# Patient Record
Sex: Female | Born: 1980 | Race: Black or African American | Hispanic: No | Marital: Single | Smoking: Current some day smoker
Health system: Southern US, Community
[De-identification: ages and names within clinical notes are randomized; demographics above are authoritative.]

## PROBLEM LIST (undated history)

## (undated) DIAGNOSIS — M719 Bursopathy, unspecified: Secondary | ICD-10-CM

## (undated) DIAGNOSIS — D573 Sickle-cell trait: Secondary | ICD-10-CM

## (undated) DIAGNOSIS — R87629 Unspecified abnormal cytological findings in specimens from vagina: Secondary | ICD-10-CM

## (undated) DIAGNOSIS — O139 Gestational [pregnancy-induced] hypertension without significant proteinuria, unspecified trimester: Secondary | ICD-10-CM

## (undated) HISTORY — DX: Gestational (pregnancy-induced) hypertension without significant proteinuria, unspecified trimester: O13.9

## (undated) HISTORY — DX: Unspecified abnormal cytological findings in specimens from vagina: R87.629

## (undated) HISTORY — PX: OTHER SURGICAL HISTORY: SHX169

---

## 2000-09-04 ENCOUNTER — Emergency Department (HOSPITAL_COMMUNITY): Admission: EM | Admit: 2000-09-04 | Discharge: 2000-09-04 | Payer: Self-pay | Admitting: Emergency Medicine

## 2001-05-18 ENCOUNTER — Emergency Department (HOSPITAL_COMMUNITY): Admission: EM | Admit: 2001-05-18 | Discharge: 2001-05-18 | Payer: Self-pay

## 2001-05-28 ENCOUNTER — Emergency Department (HOSPITAL_COMMUNITY): Admission: EM | Admit: 2001-05-28 | Discharge: 2001-05-28 | Payer: Self-pay | Admitting: Emergency Medicine

## 2001-10-30 ENCOUNTER — Emergency Department (HOSPITAL_COMMUNITY): Admission: EM | Admit: 2001-10-30 | Discharge: 2001-10-30 | Payer: Self-pay | Admitting: Emergency Medicine

## 2001-12-12 ENCOUNTER — Emergency Department (HOSPITAL_COMMUNITY): Admission: EM | Admit: 2001-12-12 | Discharge: 2001-12-12 | Payer: Self-pay | Admitting: Emergency Medicine

## 2002-03-02 ENCOUNTER — Encounter (INDEPENDENT_AMBULATORY_CARE_PROVIDER_SITE_OTHER): Payer: Self-pay

## 2002-03-02 ENCOUNTER — Encounter: Admission: RE | Admit: 2002-03-02 | Discharge: 2002-03-02 | Payer: Self-pay | Admitting: *Deleted

## 2002-03-05 ENCOUNTER — Inpatient Hospital Stay (HOSPITAL_COMMUNITY): Admission: AD | Admit: 2002-03-05 | Discharge: 2002-03-05 | Payer: Self-pay | Admitting: *Deleted

## 2002-03-16 ENCOUNTER — Encounter: Admission: RE | Admit: 2002-03-16 | Discharge: 2002-03-16 | Payer: Self-pay | Admitting: Obstetrics and Gynecology

## 2002-07-11 ENCOUNTER — Encounter (INDEPENDENT_AMBULATORY_CARE_PROVIDER_SITE_OTHER): Payer: Self-pay | Admitting: Specialist

## 2002-07-11 ENCOUNTER — Encounter: Admission: RE | Admit: 2002-07-11 | Discharge: 2002-07-11 | Payer: Self-pay | Admitting: Obstetrics and Gynecology

## 2002-11-07 ENCOUNTER — Encounter: Admission: RE | Admit: 2002-11-07 | Discharge: 2002-11-07 | Payer: Self-pay | Admitting: Obstetrics and Gynecology

## 2003-01-15 ENCOUNTER — Emergency Department (HOSPITAL_COMMUNITY): Admission: EM | Admit: 2003-01-15 | Discharge: 2003-01-15 | Payer: Self-pay | Admitting: Emergency Medicine

## 2003-02-16 ENCOUNTER — Emergency Department (HOSPITAL_COMMUNITY): Admission: EM | Admit: 2003-02-16 | Discharge: 2003-02-16 | Payer: Self-pay | Admitting: Emergency Medicine

## 2003-07-26 ENCOUNTER — Inpatient Hospital Stay (HOSPITAL_COMMUNITY): Admission: AD | Admit: 2003-07-26 | Discharge: 2003-07-27 | Payer: Self-pay | Admitting: Obstetrics & Gynecology

## 2003-07-29 ENCOUNTER — Inpatient Hospital Stay (HOSPITAL_COMMUNITY): Admission: AD | Admit: 2003-07-29 | Discharge: 2003-07-29 | Payer: Self-pay | Admitting: Obstetrics & Gynecology

## 2003-08-07 ENCOUNTER — Inpatient Hospital Stay (HOSPITAL_COMMUNITY): Admission: AD | Admit: 2003-08-07 | Discharge: 2003-08-07 | Payer: Self-pay | Admitting: Obstetrics and Gynecology

## 2005-02-25 ENCOUNTER — Ambulatory Visit (HOSPITAL_COMMUNITY): Admission: RE | Admit: 2005-02-25 | Discharge: 2005-02-25 | Payer: Self-pay | Admitting: Obstetrics & Gynecology

## 2005-05-05 ENCOUNTER — Ambulatory Visit (HOSPITAL_COMMUNITY): Admission: RE | Admit: 2005-05-05 | Discharge: 2005-05-05 | Payer: Self-pay | Admitting: Obstetrics

## 2005-07-14 ENCOUNTER — Ambulatory Visit (HOSPITAL_COMMUNITY): Admission: RE | Admit: 2005-07-14 | Discharge: 2005-07-14 | Payer: Self-pay | Admitting: Obstetrics

## 2005-09-10 ENCOUNTER — Ambulatory Visit (HOSPITAL_COMMUNITY): Admission: RE | Admit: 2005-09-10 | Discharge: 2005-09-10 | Payer: Self-pay | Admitting: Obstetrics

## 2005-09-10 ENCOUNTER — Inpatient Hospital Stay (HOSPITAL_COMMUNITY): Admission: AD | Admit: 2005-09-10 | Discharge: 2005-09-10 | Payer: Self-pay | Admitting: Obstetrics & Gynecology

## 2005-09-14 ENCOUNTER — Ambulatory Visit (HOSPITAL_COMMUNITY): Admission: RE | Admit: 2005-09-14 | Discharge: 2005-09-14 | Payer: Self-pay | Admitting: Obstetrics & Gynecology

## 2005-09-23 ENCOUNTER — Inpatient Hospital Stay (HOSPITAL_COMMUNITY): Admission: AD | Admit: 2005-09-23 | Discharge: 2005-09-27 | Payer: Self-pay | Admitting: Obstetrics & Gynecology

## 2005-09-25 ENCOUNTER — Encounter (INDEPENDENT_AMBULATORY_CARE_PROVIDER_SITE_OTHER): Payer: Self-pay | Admitting: *Deleted

## 2006-03-10 ENCOUNTER — Emergency Department (HOSPITAL_COMMUNITY): Admission: EM | Admit: 2006-03-10 | Discharge: 2006-03-10 | Payer: Self-pay | Admitting: Emergency Medicine

## 2006-09-20 ENCOUNTER — Emergency Department (HOSPITAL_COMMUNITY): Admission: EM | Admit: 2006-09-20 | Discharge: 2006-09-20 | Payer: Self-pay | Admitting: Emergency Medicine

## 2006-11-06 ENCOUNTER — Emergency Department (HOSPITAL_COMMUNITY): Admission: EM | Admit: 2006-11-06 | Discharge: 2006-11-06 | Payer: Self-pay | Admitting: Emergency Medicine

## 2007-07-18 ENCOUNTER — Inpatient Hospital Stay (HOSPITAL_COMMUNITY): Admission: AD | Admit: 2007-07-18 | Discharge: 2007-07-18 | Payer: Self-pay | Admitting: Obstetrics & Gynecology

## 2007-08-23 ENCOUNTER — Ambulatory Visit (HOSPITAL_COMMUNITY): Admission: RE | Admit: 2007-08-23 | Discharge: 2007-08-23 | Payer: Self-pay | Admitting: Obstetrics

## 2007-08-23 ENCOUNTER — Encounter: Payer: Self-pay | Admitting: Obstetrics

## 2007-10-11 ENCOUNTER — Ambulatory Visit (HOSPITAL_COMMUNITY): Admission: RE | Admit: 2007-10-11 | Discharge: 2007-10-11 | Payer: Self-pay | Admitting: Obstetrics

## 2008-03-06 ENCOUNTER — Inpatient Hospital Stay (HOSPITAL_COMMUNITY): Admission: AD | Admit: 2008-03-06 | Discharge: 2008-03-08 | Payer: Self-pay | Admitting: Obstetrics

## 2008-04-05 ENCOUNTER — Emergency Department (HOSPITAL_COMMUNITY): Admission: EM | Admit: 2008-04-05 | Discharge: 2008-04-05 | Payer: Self-pay | Admitting: Emergency Medicine

## 2008-04-05 ENCOUNTER — Other Ambulatory Visit: Payer: Self-pay | Admitting: Obstetrics

## 2008-04-07 ENCOUNTER — Emergency Department (HOSPITAL_COMMUNITY): Admission: EM | Admit: 2008-04-07 | Discharge: 2008-04-07 | Payer: Self-pay | Admitting: Emergency Medicine

## 2008-08-08 ENCOUNTER — Emergency Department (HOSPITAL_COMMUNITY): Admission: EM | Admit: 2008-08-08 | Discharge: 2008-08-08 | Payer: Self-pay | Admitting: Family Medicine

## 2008-12-02 ENCOUNTER — Emergency Department (HOSPITAL_COMMUNITY): Admission: EM | Admit: 2008-12-02 | Discharge: 2008-12-02 | Payer: Self-pay | Admitting: Family Medicine

## 2010-01-18 ENCOUNTER — Emergency Department (HOSPITAL_COMMUNITY)
Admission: EM | Admit: 2010-01-18 | Discharge: 2010-01-18 | Payer: Self-pay | Source: Home / Self Care | Admitting: Emergency Medicine

## 2010-02-16 ENCOUNTER — Encounter: Payer: Self-pay | Admitting: Obstetrics & Gynecology

## 2010-04-03 ENCOUNTER — Inpatient Hospital Stay (INDEPENDENT_AMBULATORY_CARE_PROVIDER_SITE_OTHER)
Admission: RE | Admit: 2010-04-03 | Discharge: 2010-04-03 | Disposition: A | Discharge status: Home / Self Care | Payer: BC Managed Care – PPO | Source: Ambulatory Visit | Attending: Family Medicine | Admitting: Family Medicine

## 2010-04-03 DIAGNOSIS — R05 Cough: Secondary | ICD-10-CM

## 2010-04-07 LAB — WET PREP, GENITAL
Clue Cells Wet Prep HPF POC: NONE SEEN
Yeast Wet Prep HPF POC: NONE SEEN

## 2010-04-07 LAB — POCT URINALYSIS DIPSTICK
Hgb urine dipstick: NEGATIVE
Protein, ur: NEGATIVE mg/dL
Specific Gravity, Urine: 1.02 (ref 1.005–1.030)
Urobilinogen, UA: 0.2 mg/dL (ref 0.0–1.0)
pH: 6 (ref 5.0–8.0)

## 2010-04-07 LAB — GC/CHLAMYDIA PROBE AMP, GENITAL: Chlamydia, DNA Probe: NEGATIVE

## 2010-04-30 LAB — POCT URINALYSIS DIP (DEVICE)
Bilirubin Urine: NEGATIVE
Ketones, ur: 15 mg/dL — AB
Nitrite: NEGATIVE
Urobilinogen, UA: 0.2 mg/dL (ref 0.0–1.0)

## 2010-04-30 LAB — POCT PREGNANCY, URINE: Preg Test, Ur: NEGATIVE

## 2010-05-08 LAB — URINALYSIS, ROUTINE W REFLEX MICROSCOPIC
Hgb urine dipstick: NEGATIVE
Ketones, ur: NEGATIVE mg/dL
Nitrite: NEGATIVE

## 2010-05-08 LAB — COMPREHENSIVE METABOLIC PANEL
AST: 16 U/L (ref 0–37)
Albumin: 3.7 g/dL (ref 3.5–5.2)
Alkaline Phosphatase: 116 U/L (ref 39–117)
BUN: 4 mg/dL — ABNORMAL LOW (ref 6–23)
CO2: 27 mEq/L (ref 19–32)
Creatinine, Ser: 0.73 mg/dL (ref 0.4–1.2)
GFR calc Af Amer: >60 mL/min (ref >60)
GFR calc non Af Amer: >60 mL/min (ref >60)
Sodium: 141 mEq/L (ref 135–145)
Total Bilirubin: 0.3 mg/dL (ref 0.3–1.2)
Total Protein: 6.9 g/dL (ref 6.0–8.3)

## 2010-05-08 LAB — CBC
Hemoglobin: 12.5 g/dL (ref 12.0–15.0)
RDW: 14.9 % (ref 11.5–15.5)

## 2010-05-08 LAB — LACTATE DEHYDROGENASE: LDH: 163 U/L (ref 94–250)

## 2010-05-13 LAB — CBC
HCT: 30.4 % — ABNORMAL LOW (ref 36.0–46.0)
Hemoglobin: 10 g/dL — ABNORMAL LOW (ref 12.0–15.0)
MCHC: 33 g/dL (ref 30.0–36.0)
MCV: 80.5 fL (ref 78.0–100.0)
Platelets: 164 10*3/uL (ref 150–400)
Platelets: 172 10*3/uL (ref 150–400)
RBC: 3.95 MIL/uL (ref 3.87–5.11)
RDW: 14.9 % (ref 11.5–15.5)
WBC: 11.4 10*3/uL — ABNORMAL HIGH (ref 4.0–10.5)

## 2010-05-13 LAB — DIFFERENTIAL
Basophils Absolute: 0 10*3/uL (ref 0.0–0.1)
Eosinophils Absolute: 0.2 10*3/uL (ref 0.0–0.7)
Lymphocytes Relative: 20 % (ref 12–46)
Lymphs Abs: 1.9 10*3/uL (ref 0.7–4.0)
Neutrophils Relative %: 69 % (ref 43–77)

## 2010-05-13 LAB — RPR: RPR Ser Ql: NONREACTIVE

## 2010-07-14 ENCOUNTER — Encounter: Payer: BC Managed Care – PPO | Admitting: Obstetrics & Gynecology

## 2010-10-23 LAB — WET PREP, GENITAL
Clue Cells Wet Prep HPF POC: NONE SEEN
Trich, Wet Prep: NONE SEEN
Yeast Wet Prep HPF POC: NONE SEEN

## 2010-10-23 LAB — URINALYSIS, ROUTINE W REFLEX MICROSCOPIC
Bilirubin Urine: NEGATIVE
Nitrite: NEGATIVE
Specific Gravity, Urine: 1.015
pH: 6.5

## 2010-10-23 LAB — CBC
Platelets: 300
RBC: 4.75
WBC: 8.8

## 2010-10-23 LAB — POCT PREGNANCY, URINE: Operator id: 27769

## 2010-10-23 LAB — HCG, QUANTITATIVE, PREGNANCY: hCG, Beta Chain, Quant, S: 13500 — ABNORMAL HIGH

## 2010-10-23 LAB — URINE MICROSCOPIC-ADD ON

## 2010-10-23 LAB — ABO/RH: ABO/RH(D): O POS

## 2010-11-06 LAB — URINE MICROSCOPIC-ADD ON

## 2010-11-06 LAB — URINALYSIS, ROUTINE W REFLEX MICROSCOPIC
Bilirubin Urine: NEGATIVE
Glucose, UA: NEGATIVE
Ketones, ur: NEGATIVE
Nitrite: NEGATIVE
Protein, ur: NEGATIVE
Specific Gravity, Urine: 1.015
Urobilinogen, UA: 0.2
pH: 6

## 2010-11-06 LAB — POCT CARDIAC MARKERS
Myoglobin, poc: 73.5
Operator id: 272551

## 2010-11-06 LAB — I-STAT 8, (EC8 V) (CONVERTED LAB)
BUN: 8
Bicarbonate: 26.5 — ABNORMAL HIGH
Glucose, Bld: 92
HCT: 43
Hemoglobin: 14.6
Operator id: 272551
Potassium: 5.3 — ABNORMAL HIGH
Sodium: 137
TCO2: 28

## 2011-06-04 ENCOUNTER — Encounter (HOSPITAL_COMMUNITY): Payer: Self-pay | Admitting: Emergency Medicine

## 2011-06-04 ENCOUNTER — Emergency Department (INDEPENDENT_AMBULATORY_CARE_PROVIDER_SITE_OTHER)
Admission: EM | Admit: 2011-06-04 | Discharge: 2011-06-04 | Disposition: A | Discharge status: Home / Self Care | Payer: BC Managed Care – PPO | Source: Home / Self Care | Attending: Family Medicine | Admitting: Family Medicine

## 2011-06-04 DIAGNOSIS — L710 Perioral dermatitis: Secondary | ICD-10-CM

## 2011-06-04 DIAGNOSIS — L719 Rosacea, unspecified: Secondary | ICD-10-CM

## 2011-06-04 MED ORDER — METRONIDAZOLE 1 % EX GEL: Freq: Every day | CUTANEOUS | Status: DC

## 2011-06-04 MED ORDER — PREDNISONE 20 MG PO TABS: ORAL_TABLET | ORAL | Status: AC

## 2011-06-04 MED ORDER — HYDROXYZINE HCL 50 MG PO TABS: 50.0000 mg | ORAL_TABLET | Freq: Three times a day (TID) | ORAL | Status: AC | PRN

## 2011-06-04 NOTE — Discharge Instructions (Signed)
Avoid using any cosmetics or chemicals in-your-face and especially around or in your lips. Take the prescribed medication as instructed. Can use Vaseline only for lubrication. You  need to use the prescribed gel for at least 8 weeks and keep using even even after your symptoms improve. Followup with a dermatologist if worsening or recurrent symptoms despite following treatment .

## 2011-06-04 NOTE — ED Notes (Signed)
PT HERE WITH LIP SWELLING THAT STARTED SLOWLY 4 WEEKS AGO BUT HAS PROGRESSED WITH OUTBREAK AROUND LIPS,ITCHING AND IRRITATION.PT TRIED BENADRYL/CARMAX BUT NOT WORKING.

## 2011-06-05 NOTE — ED Provider Notes (Signed)
History     CSN: [id number]  Arrival date & time 06/04/11  1630   First MD Initiated Contact with Patient 06/04/11 1636      Chief Complaint  Patient presents with  . Rash    (Consider location/radiation/quality/duration/timing/severity/associated sxs/prior treatment) HPI Comments: 31 y/o female no significant PMH here c/o redness, swelling and itchiness around lips for about 4 weeks was mildly initially but worsening. Has used carmax, and cocoa butter. Also benadryl helps but symptoms recur. No new cosmetics or known exposures.    History reviewed. No pertinent past medical history.  History reviewed. No pertinent past surgical history.  No family history on file.  History  Substance Use Topics  . Smoking status: Current Everyday Smoker  . Smokeless tobacco: Not on file  . Alcohol Use: No    OB History    Grav Para Term Preterm Abortions TAB SAB Ect Mult Living                  Review of Systems  Constitutional: Negative for fever, chills, appetite change and unexpected weight change.  HENT: Negative for congestion, sore throat, mouth sores, trouble swallowing and neck pain.   Musculoskeletal: Negative for arthralgias.  Skin: Positive for rash.       As per HPI  Neurological: Negative for headaches.    Allergies  Review of patient's allergies indicates no known allergies.  Home Medications   Current Outpatient Rx  Name Route Sig Dispense Refill  . HYDROXYZINE HCL 50 MG PO TABS Oral Take 1 tablet (50 mg total) by mouth 3 (three) times daily as needed for itching. 30 tablet 0  . METRONIDAZOLE 1 % EX GEL Topical Apply topically daily. Apply daily in affected area for 8 weeks 60 g 1  . PREDNISONE 20 MG PO TABS  2 tabs po daily for 5 days 10 tablet no    BP 116/71  Pulse 86  Temp(Src) 98.4 F (36.9 C) (Oral)  Resp 18  SpO2 100%  Physical Exam  Nursing note and vitals reviewed. Constitutional: She is oriented to person, place, and time. She appears  well-developed and well-nourished. No distress.  HENT:  Head: Normocephalic and atraumatic.  Nose: Nose normal.  Mouth/Throat: Oropharynx is clear and moist. No oropharyngeal exudate.  Eyes: Conjunctivae are normal. Pupils are equal, round, and reactive to light. Right eye exhibits no discharge. Left eye exhibits no discharge. No scleral icterus.  Neck: Normal range of motion. Neck supple.  Cardiovascular: Normal heart sounds.   Pulmonary/Chest: Breath sounds normal.  Abdominal: Soft. There is no tenderness.  Lymphadenopathy:    She has no cervical adenopathy.  Neurological: She is alert and oriented to person, place, and time.  Skin:       Perioral fine papular erythema impress some degree of  hypopigmentation. Mild perioral and lip swelling. No brakes, vesicles, pustules, crustings or scabs. No lips or buccal mucosa ulcers.     ED Course  Procedures (including critical care time)  Labs Reviewed - No data to display No results found.   1. POD (perioral dermatitis)       MDM  Patient denies prior topical steroid use. Decided to treat with hydroxyzine, metronidazol gel and oral prednisone. Follow up with dermatology as needed.        Sharin Grave, MD 06/06/11 2956

## 2011-07-07 ENCOUNTER — Encounter (HOSPITAL_COMMUNITY): Payer: Self-pay | Admitting: *Deleted

## 2011-07-07 ENCOUNTER — Emergency Department (HOSPITAL_COMMUNITY)
Admission: EM | Admit: 2011-07-07 | Discharge: 2011-07-07 | Disposition: A | Discharge status: Home / Self Care | Payer: BC Managed Care – PPO | Attending: Emergency Medicine | Admitting: Emergency Medicine

## 2011-07-07 DIAGNOSIS — K047 Periapical abscess without sinus: Secondary | ICD-10-CM | POA: Insufficient documentation

## 2011-07-07 DIAGNOSIS — K029 Dental caries, unspecified: Secondary | ICD-10-CM | POA: Insufficient documentation

## 2011-07-07 DIAGNOSIS — F17200 Nicotine dependence, unspecified, uncomplicated: Secondary | ICD-10-CM | POA: Insufficient documentation

## 2011-07-07 MED ORDER — IBUPROFEN 800 MG PO TABS
800.0000 mg | ORAL_TABLET | Freq: Once | ORAL | Status: AC
  Administered 2011-07-07: 800 mg via ORAL
  Filled 2011-07-07: qty 1

## 2011-07-07 MED ORDER — PENICILLIN V POTASSIUM 500 MG PO TABS: 500.0000 mg | ORAL_TABLET | Freq: Three times a day (TID) | ORAL | Status: AC

## 2011-07-07 MED ORDER — IBUPROFEN 800 MG PO TABS: 800.0000 mg | ORAL_TABLET | Freq: Three times a day (TID) | ORAL | Status: AC

## 2011-07-07 MED ORDER — HYDROCODONE-ACETAMINOPHEN 5-500 MG PO TABS: 1.0000 | ORAL_TABLET | Freq: Four times a day (QID) | ORAL | Status: AC | PRN

## 2011-07-07 NOTE — ED Provider Notes (Signed)
History     CSN: [id number]  Arrival date & time 07/07/11  5621   First MD Initiated Contact with Patient 07/07/11 [phone]      Chief Complaint  Patient presents with  . Dental Pain    (Consider location/radiation/quality/duration/timing/severity/associated sxs/prior treatment) Patient is a 31 y.o. female presenting with tooth pain. The history is provided by the patient.  Dental Pain  Patient presents to ER complaining of right lower dental pain x 24 hours that was acute onset and associated with a decayed tooth that lost its cap about 2 months ago. Patient denies fevers, chills, facial swelling, difficulty breathing or swallowing. Pain aggravated by chewing and cold air. Took tylenol yesterday without relief of pain. Pain is constant and unchanging. Denies HA, n/v. States she has no known medical problems.   History reviewed. No pertinent past medical history.  History reviewed. No pertinent past surgical history.  No family history on file.  History  Substance Use Topics  . Smoking status: Current Everyday Smoker  . Smokeless tobacco: Not on file  . Alcohol Use: No    OB History    Grav Para Term Preterm Abortions TAB SAB Ect Mult Living                  Review of Systems  All other systems reviewed and are negative.    Allergies  Review of patient's allergies indicates no known allergies.  Home Medications   Current Outpatient Rx  Name Route Sig Dispense Refill  . HYDROCODONE-ACETAMINOPHEN 5-500 MG PO TABS Oral Take 1-2 tablets by mouth every 6 (six) hours as needed for pain. 15 tablet 0  . IBUPROFEN 800 MG PO TABS Oral Take 1 tablet (800 mg total) by mouth 3 (three) times daily. Take 800mg  by mouth at breakfast, lunch and dinner for the next 5 days 21 tablet 0  . PENICILLIN V POTASSIUM 500 MG PO TABS Oral Take 1 tablet (500 mg total) by mouth 3 (three) times daily. 30 tablet 0    BP 135/100  Pulse 84  Temp(Src) 98.2 F (36.8 C) (Oral)  Resp 18  SpO2  100%  Physical Exam  Nursing note and vitals reviewed. Constitutional: She is oriented to person, place, and time. She appears well-developed and well-nourished.       Non toxic appearing.   HENT:  Head: Normocephalic and atraumatic.       Massive dental decay of right lower premolar with gingival edema and with TTP of surrounding gingiva but no gingival mass or fluctuance.   Eyes: Conjunctivae are normal.  Neck: Normal range of motion. Neck supple.  Cardiovascular: Normal rate and regular rhythm.   Pulmonary/Chest: Effort normal and breath sounds normal.  Musculoskeletal: Normal range of motion.  Lymphadenopathy:    She has no cervical adenopathy.  Neurological: She is alert and oriented to person, place, and time.  Skin: Skin is warm and dry.  Psychiatric: She has a normal mood and affect. Her behavior is normal.    ED Course  Procedures (including critical care time)  Patient drove self to ER. PO ibuprofen.   Labs Reviewed - No data to display No results found.   1. Dental abscess       MDM  Gingival edema surrounding decayed tooth but no fluctuance or induration or right lower premolar. Non toxica appearing and afebrile. Drinking without difficulty. Dental follow up given.         [place], [place] 07/07/11 [phone]

## 2011-07-07 NOTE — ED Provider Notes (Signed)
Medical screening examination/treatment/procedure(s) were performed by non-physician practitioner and as supervising physician I was immediately available for consultation/collaboration.  Olivia Mackie, MD 07/07/11 [phone]

## 2011-07-07 NOTE — ED Notes (Signed)
PT is here with right lower tooth and jaw pain that started yesterday

## 2011-07-07 NOTE — Discharge Instructions (Signed)
Apply warm compresses to jaw throughout the day. Take antibiotic in completion. Take hydrocodone-acetaminophen as directed, as needed for pain but do not drive or operate machinery with pain medication use and alternate with ibuprofen for additional pain relief. Followup with a dentist is very important for ongoing evaluation and management of recurrent dental pain with dental infection. Call on call dentist today to schedule close follow up appointment. However, return to emergency department for emergent changing or worsening symptoms.  Abscessed Tooth A tooth abscess is a collection of infected fluid (pus) from a bacterial infection in the inner part of the tooth (pulp). It usually occurs at the end of the tooth's root.  CAUSES   A very bad cavity (extensive tooth decay).   Trauma to the tooth, such as a broken or chipped tooth, that allows bacteria to enter into the pulp.  SYMPTOMS  Severe pain in and around the infected tooth.   Swelling and redness around the abscessed tooth or in the mouth or face.   Tenderness.   Pus drainage.   Bad breath.   Bitter taste in the mouth.   Difficulty swallowing.   Difficulty opening the mouth.   Feeling sick to your stomach (nauseous).   Vomiting.   Chills.   Swollen neck glands.  DIAGNOSIS  A medical and dental history will be taken.   An examination will be performed by tapping on the abscessed tooth.   X-rays may be taken of the tooth to identify the abscess.  TREATMENT The goal of treatment is to eliminate the infection.   You may be prescribed antibiotic medicine to stop the infection from spreading.   A root canal may be performed to save the tooth. If the tooth cannot be saved, it may be pulled (extracted) and the abscess may be drained.  HOME CARE INSTRUCTIONS  Only take over-the-counter or prescription medicines for pain, fever, or discomfort as directed by your caregiver.   Do not drive after taking pain medicine  (narcotics).   Rinse your mouth (gargle) often with salt water ( tsp salt in 8 oz of warm water) to relieve pain or swelling.   Do not apply heat to the outside of your face.   Return to your dentist for further treatment as directed.  SEEK IMMEDIATE DENTAL CARE IF:  You have a temperature by mouth above 102 F (38.9 C), not controlled by medicine.   You have chills or a very bad headache.   You have problems breathing or swallowing.   Your have trouble opening your mouth.   You develop swelling in the neck or around the eye.   Your pain is not helped by medicine.   Your pain is getting worse instead of better.  Document Released: 01/12/2005 Document Revised: 01/01/2011 Document Reviewed: 04/22/2010 [facility] Patient Information 2012 [place], [place].

## 2012-01-20 ENCOUNTER — Encounter (HOSPITAL_COMMUNITY): Payer: Self-pay | Admitting: Adult Health

## 2012-01-20 ENCOUNTER — Emergency Department (HOSPITAL_COMMUNITY)
Admission: EM | Admit: 2012-01-20 | Discharge: 2012-01-20 | Disposition: A | Discharge status: Home / Self Care | Payer: BC Managed Care – PPO | Attending: Emergency Medicine | Admitting: Emergency Medicine

## 2012-01-20 DIAGNOSIS — R220 Localized swelling, mass and lump, head: Secondary | ICD-10-CM | POA: Insufficient documentation

## 2012-01-20 DIAGNOSIS — F17200 Nicotine dependence, unspecified, uncomplicated: Secondary | ICD-10-CM | POA: Insufficient documentation

## 2012-01-20 DIAGNOSIS — K047 Periapical abscess without sinus: Secondary | ICD-10-CM

## 2012-01-20 MED ORDER — PENICILLIN V POTASSIUM 500 MG PO TABS: 500.0000 mg | ORAL_TABLET | Freq: Four times a day (QID) | ORAL | Status: DC

## 2012-01-20 MED ORDER — TRAMADOL HCL 50 MG PO TABS: 50.0000 mg | ORAL_TABLET | Freq: Once | ORAL | Status: DC

## 2012-01-20 MED ORDER — TRAMADOL HCL 50 MG PO TABS
50.0000 mg | ORAL_TABLET | Freq: Once | ORAL | Status: AC
  Administered 2012-01-20: 50 mg via ORAL
  Filled 2012-01-20: qty 1

## 2012-01-20 MED ORDER — PENICILLIN V POTASSIUM 250 MG PO TABS
500.0000 mg | ORAL_TABLET | Freq: Once | ORAL | Status: AC
  Administered 2012-01-20: 500 mg via ORAL
  Filled 2012-01-20: qty 2

## 2012-01-20 NOTE — ED Provider Notes (Signed)
Medical screening examination/treatment/procedure(s) were performed by non-physician practitioner and as supervising physician I was immediately available for consultation/collaboration.    Mikkel Charrette R Nickalus Thornsberry, MD 01/20/12 2205 

## 2012-01-20 NOTE — ED Notes (Signed)
Presents with lower right dental pain . Dental caries noted. Pain for 2 days

## 2012-01-20 NOTE — ED Provider Notes (Signed)
History     CSN: [id number]  Arrival date & time 01/20/12  2019   First MD Initiated Contact with Patient 01/20/12 2039      Chief Complaint  Patient presents with  . Dental Pain    (Consider location/radiation/quality/duration/timing/severity/associated sxs/prior treatment) HPI Comments: Patient with a large cavity in R lower 2nd molar for months now with facial swelling and increased pain Has been using Ibuprofen without relief   Patient is a 31 y.o. female presenting with tooth pain. The history is provided by the patient.  Dental PainThe primary symptoms include mouth pain. Primary symptoms do not include dental injury, headaches, fever or sore throat. The symptoms began more than 1 month ago. The symptoms are worsening. The symptoms are chronic. The symptoms occur constantly.  Additional symptoms include: facial swelling. Additional symptoms do not include: trouble swallowing.    History reviewed. No pertinent past medical history.  History reviewed. No pertinent past surgical history.  History reviewed. No pertinent family history.  History  Substance Use Topics  . Smoking status: Current Every Day Smoker  . Smokeless tobacco: Not on file  . Alcohol Use: No    OB History    Grav Para Term Preterm Abortions TAB SAB Ect Mult Living                  Review of Systems  Constitutional: Negative for fever.  HENT: Positive for facial swelling and dental problem. Negative for congestion, sore throat, rhinorrhea and trouble swallowing.   Respiratory: Negative.   Gastrointestinal: Negative.   Genitourinary: Negative.   Musculoskeletal: Negative.   Skin: Negative.   Neurological: Negative for dizziness and headaches.    Allergies  Review of patient's allergies indicates no known allergies.  Home Medications   Current Outpatient Rx  Name  Route  Sig  Dispense  Refill  . IBUPROFEN 200 MG PO TABS   Oral   Take 600 mg by mouth every 4 (four) hours as needed. For  pain         . PRENATAL PO   Oral   Take 1 tablet by mouth daily.         Marland Kitchen PENICILLIN V POTASSIUM 500 MG PO TABS   Oral   Take 1 tablet (500 mg total) by mouth 4 (four) times daily.   39 tablet   0   . TRAMADOL HCL 50 MG PO TABS   Oral   Take 1 tablet (50 mg total) by mouth once.   30 tablet   0     BP 129/85  Pulse 88  Temp 97.4 F (36.3 C) (Oral)  Resp 20  SpO2 99%  Physical Exam  Constitutional: She is oriented to person, place, and time. She appears well-developed and well-nourished.  HENT:  Head: Normocephalic.    Right Ear: External ear normal.  Left Ear: External ear normal.  Mouth/Throat: Oropharynx is clear and moist. No tonsillar abscesses.    Eyes: Pupils are equal, round, and reactive to light.  Cardiovascular: Normal rate.   Pulmonary/Chest: Effort normal.  Musculoskeletal: Normal range of motion.  Lymphadenopathy:    She has cervical adenopathy.  Neurological: She is alert and oriented to person, place, and time.  Skin: Skin is warm.    ED Course  Procedures (including critical care time)  Labs Reviewed - No data to display No results found.   1. Dental abscess       MDM   Dental abscess  Arman Filter, NP 01/20/12 2109

## 2012-05-18 ENCOUNTER — Other Ambulatory Visit: Payer: Self-pay

## 2012-05-18 ENCOUNTER — Encounter (HOSPITAL_COMMUNITY): Payer: Self-pay | Admitting: Emergency Medicine

## 2012-05-18 ENCOUNTER — Emergency Department (HOSPITAL_COMMUNITY): Payer: BC Managed Care – PPO

## 2012-05-18 ENCOUNTER — Emergency Department (HOSPITAL_COMMUNITY)
Admission: EM | Admit: 2012-05-18 | Discharge: 2012-05-18 | Disposition: A | Discharge status: Home / Self Care | Payer: BC Managed Care – PPO | Attending: Emergency Medicine | Admitting: Emergency Medicine

## 2012-05-18 DIAGNOSIS — R002 Palpitations: Secondary | ICD-10-CM | POA: Insufficient documentation

## 2012-05-18 DIAGNOSIS — F17200 Nicotine dependence, unspecified, uncomplicated: Secondary | ICD-10-CM | POA: Insufficient documentation

## 2012-05-18 DIAGNOSIS — R0602 Shortness of breath: Secondary | ICD-10-CM | POA: Insufficient documentation

## 2012-05-18 DIAGNOSIS — A599 Trichomoniasis, unspecified: Secondary | ICD-10-CM | POA: Insufficient documentation

## 2012-05-18 DIAGNOSIS — Z3202 Encounter for pregnancy test, result negative: Secondary | ICD-10-CM | POA: Insufficient documentation

## 2012-05-18 DIAGNOSIS — Z79899 Other long term (current) drug therapy: Secondary | ICD-10-CM | POA: Insufficient documentation

## 2012-05-18 DIAGNOSIS — R42 Dizziness and giddiness: Secondary | ICD-10-CM

## 2012-05-18 LAB — COMPREHENSIVE METABOLIC PANEL
ALT: 16 U/L (ref 0–35)
Albumin: 4 g/dL (ref 3.5–5.2)
Calcium: 9.7 mg/dL (ref 8.4–10.5)
GFR calc Af Amer: >90 mL/min (ref >90)
Glucose, Bld: 90 mg/dL (ref 70–99)
Sodium: 139 mEq/L (ref 135–145)
Total Protein: 7.8 g/dL (ref 6.0–8.3)

## 2012-05-18 LAB — URINALYSIS, MICROSCOPIC ONLY
Bilirubin Urine: NEGATIVE
Nitrite: NEGATIVE
Specific Gravity, Urine: 1.014 (ref 1.005–1.030)
pH: 7.5 (ref 5.0–8.0)

## 2012-05-18 LAB — CBC WITH DIFFERENTIAL/PLATELET
Basophils Absolute: 0 10*3/uL (ref 0.0–0.1)
Basophils Relative: 0 % (ref 0–1)
Eosinophils Absolute: 0.6 10*3/uL (ref 0.0–0.7)
Eosinophils Relative: 7 % — ABNORMAL HIGH (ref 0–5)
Lymphs Abs: 2.7 10*3/uL (ref 0.7–4.0)
MCH: 27 pg (ref 26.0–34.0)
MCHC: 34.7 g/dL (ref 30.0–36.0)
MCV: 77.8 fL — ABNORMAL LOW (ref 78.0–100.0)
Platelets: 280 10*3/uL (ref 150–400)
RDW: 13.1 % (ref 11.5–15.5)

## 2012-05-18 MED ORDER — METRONIDAZOLE 500 MG PO TABS: 500.0000 mg | ORAL_TABLET | Freq: Two times a day (BID) | ORAL | Status: DC

## 2012-05-18 NOTE — ED Notes (Signed)
Kelly, PA at bedside. 

## 2012-05-18 NOTE — ED Notes (Signed)
Pt states she is feeling better and denies having onset of episode of dizziness and shortness of breath since being here. Pt alert and mentating appropriately.

## 2012-05-18 NOTE — ED Provider Notes (Signed)
History     CSN: [id number]  Arrival date & time 05/18/12  1053   First MD Initiated Contact with Patient 05/18/12 1206      Chief Complaint  Patient presents with  . Dizziness  . Shortness of Breath    (Consider location/radiation/quality/duration/timing/severity/associated sxs/prior treatment) HPI Comments: Patient is a 32 year old female with no significant past medical history who presents for dizziness with position change with onset this morning. Patient states that when she reaches down towards the floor she becomes dizzy upon standing, feeling like the room is spinning. Patient states that dizziness lasts 3-5 seconds and she has had approximately 3 episodes since this morning the last of which was 2 hours ago. The patient admits to associated shortness of breath and feeling like her heart is racing. She denies fever, syncope, vision changes, hearing loss, ear pain, upper respiratory symptoms, chest pain, nausea or vomiting, abdominal pain, urinary symptoms and weakness in her extremities. Patient states LMP ended 1 week ago; on implanon birth control. She admits to a 4 hour drive last week, coming home from [place]; denies recent long plane flights.  Patient is a 32 y.o. female presenting with shortness of breath. The history is provided by the patient. No language interpreter was used.  Shortness of Breath Associated symptoms: no abdominal pain, no fever, no headaches, no neck pain and no vomiting     History reviewed. No pertinent past medical history.  History reviewed. No pertinent past surgical history.  History reviewed. No pertinent family history.  History  Substance Use Topics  . Smoking status: Current Every Day Smoker  . Smokeless tobacco: Not on file  . Alcohol Use: Yes     Comment: occ    OB History   Grav Para Term Preterm Abortions TAB SAB Ect Mult Living                  Review of Systems  Constitutional: Negative for fever.  HENT: Negative  for neck pain and neck stiffness.   Eyes: Negative for visual disturbance.  Respiratory: Positive for shortness of breath.   Cardiovascular: Positive for palpitations.  Gastrointestinal: Negative for nausea, vomiting and abdominal pain.  Neurological: Positive for dizziness. Negative for syncope, weakness and headaches.  All other systems reviewed and are negative.    Allergies  Review of patient's allergies indicates no known allergies.  Home Medications   Current Outpatient Rx  Name  Route  Sig  Dispense  Refill  . Prenatal Vit-Fe Fumarate-FA (PRENATAL PO)   Oral   Take 1 tablet by mouth daily.         . metroNIDAZOLE (FLAGYL) 500 MG tablet   Oral   Take 1 tablet (500 mg total) by mouth 2 (two) times daily.   14 tablet   0     BP 114/69  Pulse 74  Temp(Src) 98.2 F (36.8 C) (Oral)  Resp 18  SpO2 100%  LMP 05/11/2012  Physical Exam  Nursing note and vitals reviewed. Constitutional: She is oriented to person, place, and time. She appears well-developed and well-nourished. No distress.  HENT:  Head: Normocephalic and atraumatic.  Mouth/Throat: Oropharynx is clear and moist. No oropharyngeal exudate.  Eyes: Conjunctivae and EOM are normal. Pupils are equal, round, and reactive to light. No scleral icterus.  No nystagmus with EOMs  Neck: Normal range of motion. Neck supple.  Cardiovascular: Normal rate, regular rhythm, normal heart sounds and intact distal pulses.   Pulmonary/Chest: Effort normal. No respiratory distress.  She has no wheezes. She has no rales.  +rhonchi in RUL  Abdominal: Soft. She exhibits no distension and no mass. There is no tenderness. There is no rebound.  Musculoskeletal: Normal range of motion. She exhibits no edema (No pitting edema in b/l lower extremities).  Lymphadenopathy:    She has no cervical adenopathy.  Neurological: She is alert and oriented to person, place, and time. She has normal reflexes. No cranial nerve deficit.  No  sensory or motor deficits appreciated.  Skin: Skin is warm and dry. No rash noted. She is not diaphoretic. No erythema.  Psychiatric: She has a normal mood and affect. Her behavior is normal.    ED Course  Procedures (including critical care time)  Labs Reviewed  CBC WITH DIFFERENTIAL - Abnormal; Notable for the following:    MCV 77.8 (*)    Eosinophils Relative 7 (*)    All other components within normal limits  COMPREHENSIVE METABOLIC PANEL - Abnormal; Notable for the following:    Total Bilirubin 0.2 (*)    All other components within normal limits  URINALYSIS, MICROSCOPIC ONLY - Abnormal; Notable for the following:    APPearance CLOUDY (*)    Leukocytes, UA MODERATE (*)    Squamous Epithelial / LPF MANY (*)    All other components within normal limits  POCT PREGNANCY, URINE   Dg Chest 2 View  05/18/2012  *RADIOLOGY REPORT*  Clinical Data: Shortness of breath.  Left upper chest and shoulder pain.  CHEST - 2 VIEW  Comparison: One-view chest 11/06/2006  Findings: Heart size is normal.  The lungs are clear.  The visualized soft tissues and bony thorax are unremarkable.  IMPRESSION: Negative two-view chest x-ray.   Original Report Authenticated By: Marin Roberts, M.D.      Date: 05/18/2012  Rate: 70  Rhythm: normal sinus rhythm  QRS Axis: normal  Intervals: normal  ST/T Wave abnormalities: normal  Conduction Disutrbances:none  Narrative Interpretation: NSR without STEMI or T wave inversion  Old EKG Reviewed: none available I have personally reviewed and interpreted this EKG.   1. Dizziness   2. Trichomonas infection     MDM  Patient is a 32 year old female with no significant past medical history who presents for dizziness with position change with onset this morning; she admits to 3 episodes lasting 3-5 seconds each. On physical exam, patient is well and nontoxic appearing, neurovascularly intact without sensory or motor deficits; no nystagmus with EOMs. W/u to  include CBC, CMP, UA, urine preg, CXR and EKG. Patient's orthostatics negative for changes in BP or HR.  W/u significant for trichomonas on UA. CXR without evidence of fx, PTX, PNA, or pleural effusion. EKG significant for NSR without arrythmia, STEMI, or T wave inversion. Patient has remained hemodynamically stable and in good spirits without another dizziness episode; no complaints. Stable for d/c with PCP follow up for further evaluation of symptoms and flagyl for tx of trichomonas. Indications for ED return discussed. Patient states comfort and understanding with d/c plan with no unaddressed concerns.      Antony Madura, PA-C 05/19/12 1259

## 2012-05-18 NOTE — ED Notes (Signed)
Pt sts some dizziness and SOB; pt sts worse when bends over starting when waking this am

## 2012-05-18 NOTE — ED Notes (Signed)
Kelly PA at bedside   

## 2012-05-18 NOTE — ED Notes (Signed)
Pt alert and mentating appropriately upon d/c. Pt given d/c teaching, prescription, and follow up care instructions. NAD noted upon d/c Pt has no further questions and verbalizes understanding of d/c teaching. Pt ambulatory upon d/c.

## 2012-05-19 NOTE — ED Provider Notes (Signed)
Medical screening examination/treatment/procedure(s) were performed by non-physician practitioner and as supervising physician I was immediately available for consultation/collaboration. Devoria Albe, MD, Armando Gang   Ward Givens, MD 05/19/12 [phone]

## 2012-11-24 ENCOUNTER — Emergency Department (INDEPENDENT_AMBULATORY_CARE_PROVIDER_SITE_OTHER)
Admission: EM | Admit: 2012-11-24 | Discharge: 2012-11-24 | Disposition: A | Discharge status: Home / Self Care | Payer: BC Managed Care – PPO | Source: Home / Self Care | Attending: Family Medicine | Admitting: Family Medicine

## 2012-11-24 ENCOUNTER — Encounter (HOSPITAL_COMMUNITY): Payer: Self-pay | Admitting: Emergency Medicine

## 2012-11-24 DIAGNOSIS — S335XXA Sprain of ligaments of lumbar spine, initial encounter: Secondary | ICD-10-CM

## 2012-11-24 DIAGNOSIS — S39012A Strain of muscle, fascia and tendon of lower back, initial encounter: Secondary | ICD-10-CM

## 2012-11-24 DIAGNOSIS — K047 Periapical abscess without sinus: Secondary | ICD-10-CM

## 2012-11-24 HISTORY — DX: Bursopathy, unspecified: M71.9

## 2012-11-24 LAB — POCT URINALYSIS DIP (DEVICE)
Hgb urine dipstick: NEGATIVE
Protein, ur: NEGATIVE mg/dL
Specific Gravity, Urine: 1.02 (ref 1.005–1.030)
Urobilinogen, UA: 0.2 mg/dL (ref 0.0–1.0)

## 2012-11-24 MED ORDER — HYDROCODONE-ACETAMINOPHEN 5-325 MG PO TABS: 1.0000 | ORAL_TABLET | ORAL | Status: DC | PRN

## 2012-11-24 MED ORDER — PREDNISONE 20 MG PO TABS: 40.0000 mg | ORAL_TABLET | Freq: Every day | ORAL | Status: DC

## 2012-11-24 MED ORDER — IBUPROFEN 800 MG PO TABS
ORAL_TABLET | ORAL | Status: AC
  Filled 2012-11-24: qty 1

## 2012-11-24 MED ORDER — PENICILLIN V POTASSIUM 500 MG PO TABS: 500.0000 mg | ORAL_TABLET | Freq: Three times a day (TID) | ORAL | Status: DC

## 2012-11-24 MED ORDER — IBUPROFEN 800 MG PO TABS
800.0000 mg | ORAL_TABLET | Freq: Once | ORAL | Status: AC
  Administered 2012-11-24: 800 mg via ORAL

## 2012-11-24 MED ORDER — CYCLOBENZAPRINE HCL 10 MG PO TABS: 10.0000 mg | ORAL_TABLET | Freq: Three times a day (TID) | ORAL | Status: DC | PRN

## 2012-11-24 NOTE — ED Notes (Addendum)
Patient has multiple complaints: c/o left lower gum pain, history of the same.  Reports she had an abscess in the past.  Patient took two antibiotics this episode that were left over from prior episode of the same. Patient also c/o left flank pain, intermittent for several months.  No vaginal discharge, last bm was yesterday-normal per patient, denies urinary symptoms.  Patient reports being on a diet since April-no red meat, drinks water, eats vegetables, and exercises 4 times a week.

## 2012-11-29 NOTE — ED Provider Notes (Signed)
CSN: [id number]     Arrival date & time 11/24/12  1134 History   First MD Initiated Contact with Patient 11/24/12 1234     Chief Complaint  Patient presents with  . Dental Pain  . Flank Pain   (Consider location/radiation/quality/duration/timing/severity/associated sxs/prior Treatment) Patient is a 32 y.o. female presenting with tooth pain and flank pain. The history is provided by the patient.  Dental Pain Location:  Lower Lower teeth location:  31/RL 2nd molar and 32/RL 3rd molar Quality:  Pulsating Severity:  Severe Onset quality:  Gradual Timing:  Constant Progression:  Worsening Chronicity:  Recurrent Context: abscess and dental caries   Relieved by:  Nothing Worsened by:  Touching Associated symptoms: gum swelling   Associated symptoms: no difficulty swallowing, no facial pain, no facial swelling, no fever, no neck pain and no neck swelling   Risk factors: lack of dental care   Flank Pain  Pt reports onset of pain to (R) lower back molar that has worsened over last 24 hours. She was unable to sleep last night d/t pain. Pt admits she has had problems with this tooth in the past and was given antibiotic and pain improved. Pain started again yesterday and has quickly worsened. Pt took a couple of antibiotics left from last prescription but unsure of the name.  Pt also reports recurrence of (L) LBP that she has had before. The pain does not radiate and is not associated w/ any lower limb weakness, numbness or tingling. Denies incontinence of bowel or bladder. Denies UTI symptoms or fever. States she does some heavy lifting on her job and it flares from time to time. She also admits she has been trying to loose weight and has been eating healthy and working out several times a week since April.   Past Medical History  Diagnosis Date  . Bursitis    History reviewed. No pertinent past surgical history. No family history on file. History  Substance Use Topics  . Smoking status:  Current Every Day Smoker  . Smokeless tobacco: Not on file  . Alcohol Use: Yes     Comment: occ   OB History   Grav Para Term Preterm Abortions TAB SAB Ect Mult Living                 Review of Systems  Constitutional: Negative.  Negative for fever.  HENT: Negative.  Negative for facial swelling.   Eyes: Negative.   Respiratory: Negative.   Cardiovascular: Negative.   Gastrointestinal: Negative.   Endocrine: Negative.   Genitourinary: Negative for dysuria and frequency.  Musculoskeletal: Positive for back pain. Negative for neck pain.  Allergic/Immunologic: Negative.   Neurological: Negative.   Hematological: Negative.   Psychiatric/Behavioral: Negative.     Allergies  Review of patient's allergies indicates no known allergies.  Home Medications   Current Outpatient Rx  Name  Route  Sig  Dispense  Refill  . cyclobenzaprine (FLEXERIL) 10 MG tablet   Oral   Take 1 tablet (10 mg total) by mouth 3 (three) times daily as needed for muscle spasms (and or low back muscle pain).   20 tablet   0   . HYDROcodone-acetaminophen (NORCO/VICODIN) 5-325 MG per tablet   Oral   Take 1 tablet by mouth every 4 (four) hours as needed for pain.   15 tablet   0   . metroNIDAZOLE (FLAGYL) 500 MG tablet   Oral   Take 1 tablet (500 mg total) by mouth 2 (two) times  daily.   14 tablet   0   . penicillin v potassium (VEETID) 500 MG tablet   Oral   Take 1 tablet (500 mg total) by mouth 3 (three) times daily.   30 tablet   0   . predniSONE (DELTASONE) 20 MG tablet   Oral   Take 2 tablets (40 mg total) by mouth daily.   10 tablet   0   . Prenatal Vit-Fe Fumarate-FA (PRENATAL PO)   Oral   Take 1 tablet by mouth daily.          BP 125/86  Pulse 72  Temp(Src) 98.5 F (36.9 C) (Oral)  Resp 14  SpO2 98%  LMP 10/18/2012 Physical Exam  Constitutional: She is oriented to person, place, and time. She appears well-developed and well-nourished.  HENT:  Head: Normocephalic and  atraumatic.  Mouth/Throat:    Tooth number 31 w/ significant decay to gumline on anterior portion of tooth. Tooth number 32 also w/ some decay. Mild erythema and swelling to surrounding gingiva of both teeth. No pururlent drainage noted. No facial swelling.   Eyes: Conjunctivae are normal.  Neck: Neck supple.  Cardiovascular: Normal rate.   Pulmonary/Chest: Effort normal.  Genitourinary: Vagina normal.  Musculoskeletal:       Back:  TTP over (L) lumbar paraspinal region. No bony TTP.   Neurological: She is alert and oriented to person, place, and time.  Skin: Skin is warm and dry.  Psychiatric: She has a normal mood and affect.    ED Course  Procedures (including critical care time) Labs Review Labs Reviewed  POCT URINALYSIS DIP (DEVICE) - Abnormal; Notable for the following:    Ketones, ur 15 (*)    Leukocytes, UA TRACE (*)    All other components within normal limits   Imaging Review No results found.  EKG Interpretation     Ventricular Rate:    PR Interval:    QRS Duration:   QT Interval:    QTC Calculation:   R Axis:     Text Interpretation:              MDM   1. Tooth abscess   2. Lumbar strain, initial encounter    Likely early Tooth abscess of number 31 and possibly 32. Will start on Pen-V-K TID x 10 days and a short course of medication for pain. Pt given 2 dentists referrals to arrange f/u for likely tooth extraction.  (L) LBP exacerbated by heavy lifting on her job. Will give Flexeril and a week of prednisone. She is to avoid heavy lifting or strenuous exercise for a week. Pt is arrange f/u w/ orthopedic referral if back pain persists. Pt is agreeable w/ plan.     Leanne Chang, NP 11/29/12 0105  Leanne Chang, NP 12/21/12 2002

## 2012-12-23 NOTE — ED Provider Notes (Signed)
Medical screening examination/treatment/procedure(s) were performed by a resident physician or non-physician practitioner and as the supervising physician I was immediately available for consultation/collaboration.  Tommey Barret, MD    Cheyrl Buley S Aaliyah Gavel, MD 12/23/12 2000 

## 2013-03-23 ENCOUNTER — Ambulatory Visit: Payer: Self-pay | Admitting: Obstetrics

## 2013-04-06 ENCOUNTER — Encounter: Payer: Self-pay | Admitting: Obstetrics

## 2013-04-06 ENCOUNTER — Ambulatory Visit (INDEPENDENT_AMBULATORY_CARE_PROVIDER_SITE_OTHER): Payer: BC Managed Care – PPO | Admitting: Obstetrics

## 2013-04-06 VITALS — BP 125/87 | HR 83 | Temp 97.2°F | Ht 64.0 in | Wt 204.0 lb

## 2013-04-06 DIAGNOSIS — Z113 Encounter for screening for infections with a predominantly sexual mode of transmission: Secondary | ICD-10-CM

## 2013-04-06 DIAGNOSIS — Z01419 Encounter for gynecological examination (general) (routine) without abnormal findings: Secondary | ICD-10-CM

## 2013-04-06 DIAGNOSIS — Z124 Encounter for screening for malignant neoplasm of cervix: Secondary | ICD-10-CM

## 2013-04-06 MED ORDER — PNV PRENATAL PLUS MULTIVITAMIN 27-1 MG PO TABS: 1.0000 | ORAL_TABLET | Freq: Every day | ORAL | Status: DC

## 2013-04-06 NOTE — Progress Notes (Signed)
Subjective:     [name] is a 33 y.o. female here for a routine exam.  Current complaints: annual exam. Patient states she has no concerns at this time.  Personal health questionnaire reviewed: yes.   Gynecologic History Patient's last menstrual period was 04/02/2013. Contraception: Nexplanon Last Pap: 2013. Results were: normal  Obstetric History OB History  No data available     The following portions of the patient's history were reviewed and updated as appropriate: allergies, current medications, past family history, past medical history, past social history, past surgical history and problem list.  Review of Systems Pertinent items are noted in HPI.    Objective:    General appearance: alert and no distress Breasts: normal appearance, no masses or tenderness Abdomen: normal findings: soft, non-tender Pelvic: cervix normal in appearance, external genitalia normal, no adnexal masses or tenderness, no cervical motion tenderness, rectovaginal septum normal, uterus normal size, shape, and consistency and vagina normal without discharge    Assessment:    Healthy female exam.    Plan:    Contraception: Nexplanon. Follow up in: 1 year.

## 2013-04-07 LAB — WET PREP BY MOLECULAR PROBE
Candida species: NEGATIVE
GARDNERELLA VAGINALIS: NEGATIVE
TRICHOMONAS VAG: NEGATIVE

## 2013-04-07 LAB — GC/CHLAMYDIA PROBE AMP
CT PROBE, AMP APTIMA: NEGATIVE
GC Probe RNA: NEGATIVE

## 2013-04-10 LAB — PAP IG W/ RFLX HPV ASCU

## 2013-04-11 LAB — HUMAN PAPILLOMAVIRUS, HIGH RISK: HPV DNA HIGH RISK: DETECTED — AB

## 2014-02-25 ENCOUNTER — Emergency Department (HOSPITAL_COMMUNITY)
Admission: EM | Admit: 2014-02-25 | Discharge: 2014-02-25 | Disposition: A | Discharge status: Home / Self Care | Payer: BC Managed Care – PPO | Source: Home / Self Care | Attending: Family Medicine | Admitting: Family Medicine

## 2014-02-25 ENCOUNTER — Encounter (HOSPITAL_COMMUNITY): Payer: Self-pay | Admitting: *Deleted

## 2014-02-25 DIAGNOSIS — K029 Dental caries, unspecified: Secondary | ICD-10-CM

## 2014-02-25 MED ORDER — LIDOCAINE VISCOUS 2 % MT SOLN: OROMUCOSAL | Status: DC

## 2014-02-25 MED ORDER — NAPROXEN 500 MG PO TABS: 500.0000 mg | ORAL_TABLET | Freq: Two times a day (BID) | ORAL | Status: DC

## 2014-02-25 NOTE — Discharge Instructions (Signed)
Dental Pain °A tooth ache may be caused by cavities (tooth decay). Cavities expose the nerve of the tooth to air and hot or cold temperatures. It may come from an infection or abscess (also called a boil or furuncle) around your tooth. It is also often caused by dental caries (tooth decay). This causes the pain you are having. °DIAGNOSIS  °Your caregiver can diagnose this problem by exam. °TREATMENT  °· If caused by an infection, it may be treated with medications which kill germs (antibiotics) and pain medications as prescribed by your caregiver. Take medications as directed. °· Only take over-the-counter or prescription medicines for pain, discomfort, or fever as directed by your caregiver. °· Whether the tooth ache today is caused by infection or dental disease, you should see your dentist as soon as possible for further care. °SEEK MEDICAL CARE IF: °The exam and treatment you received today has been provided on an emergency basis only. This is not a substitute for complete medical or dental care. If your problem worsens or new problems (symptoms) appear, and you are unable to meet with your dentist, call or return to this location. °SEEK IMMEDIATE MEDICAL CARE IF:  °· You have a fever. °· You develop redness and swelling of your face, jaw, or neck. °· You are unable to open your mouth. °· You have severe pain uncontrolled by pain medicine. °MAKE SURE YOU:  °· Understand these instructions. °· Will watch your condition. °· Will get help right away if you are not doing well or get worse. °Document Released: 01/12/2005 Document Revised: 04/06/2011 Document Reviewed: 08/31/2007 °ExitCare® Patient Information ©2015 [employer]. This information is not intended to replace advice given to you by your health care provider. Make sure you discuss any questions you have with your health care provider. ° °Dental Caries °Dental caries (also called tooth decay) is the most common oral disease. It can occur at any age but is  more common in children and young adults.  °HOW DENTAL CARIES DEVELOPS  °The process of decay begins when bacteria and foods (particularly sugars and starches) combine in your mouth to produce plaque. Plaque is a substance that sticks to the hard, outer surface of a tooth (enamel). The bacteria in plaque produce acids that attack enamel. These acids may also attack the root surface of a tooth (cementum) if it is exposed. Repeated attacks dissolve these surfaces and create holes in the tooth (cavities). If left untreated, the acids destroy the other layers of the tooth.  °RISK FACTORS °· Frequent sipping of sugary beverages.   °· Frequent snacking on sugary and starchy foods, especially those that easily get stuck in the teeth.   °· Poor oral hygiene.   °· Dry mouth.   °· Substance abuse such as methamphetamine abuse.   °· Broken or poor-fitting dental restorations.   °· Eating disorders.   °· Gastroesophageal reflux disease (GERD).   °· Certain radiation treatments to the head and neck. °SYMPTOMS °In the early stages of dental caries, symptoms are seldom present. Sometimes white, chalky areas may be seen on the enamel or other tooth layers. In later stages, symptoms may include: °· Pits and holes on the enamel. °· Toothache after sweet, hot, or cold foods or drinks are consumed. °· Pain around the tooth. °· Swelling around the tooth. °DIAGNOSIS  °Most of the time, dental caries is detected during a regular dental checkup. A diagnosis is made after a thorough medical and dental history is taken and the surfaces of your teeth are checked for signs of   dental caries. Sometimes special instruments, such as lasers, are used to check for dental caries. Dental X-ray exams may be taken so that areas not visible to the eye (such as between the contact areas of the teeth) can be checked for cavities.  TREATMENT  If dental caries is in its early stages, it may be reversed with a fluoride treatment or an application of a  remineralizing agent at the dental office. Thorough brushing and flossing at home is needed to aid these treatments. If it is in its later stages, treatment depends on the location and extent of tooth destruction:   If a small area of the tooth has been destroyed, the destroyed area will be removed and cavities will be filled with a material such as gold, silver amalgam, or composite resin.   If a large area of the tooth has been destroyed, the destroyed area will be removed and a cap (crown) will be fitted over the remaining tooth structure.   If the center part of the tooth (pulp) is affected, a procedure called a root canal will be needed before a filling or crown can be placed.   If most of the tooth has been destroyed, the tooth may need to be pulled (extracted). HOME CARE INSTRUCTIONS You can prevent, stop, or reverse dental caries at home by practicing good oral hygiene. Good oral hygiene includes:  Thoroughly cleaning your teeth at least twice a day with a toothbrush and dental floss.   Using a fluoride toothpaste. A fluoride mouth rinse may also be used if recommended by your dentist or health care provider.   Restricting the amount of sugary and starchy foods and sugary liquids you consume.   Avoiding frequent snacking on these foods and sipping of these liquids.   Keeping regular visits with a dentist for checkups and cleanings. PREVENTION   Practice good oral hygiene.  Consider a dental sealant. A dental sealant is a coating material that is applied by your dentist to the pits and grooves of teeth. The sealant prevents food from being trapped in them. It may protect the teeth for several years.  Ask about fluoride supplements if you live in a community without fluorinated water or with water that has a low fluoride content. Use fluoride supplements as directed by your dentist or health care provider.  Allow fluoride varnish applications to teeth if directed by your  dentist or health care provider. Document Released: 10/04/2001 Document Revised: 05/29/2013 Document Reviewed: 01/15/2012 [facility] Patient Information 2015 [place]Care, [place]. This information is not intended to replace advice given to you by your health care provider. Make sure you discuss any questions you have with your health care provider.  Dental Care and Dentist Visits Dental care supports good overall health. Regular dental visits can also help you avoid dental pain, bleeding, infection, and other more serious health problems in the future. It is important to keep the mouth healthy because diseases in the teeth, gums, and other oral tissues can spread to other areas of the body. Some problems, such as diabetes, heart disease, and pre-term labor have been associated with poor oral health.  See your dentist every 6 months. If you experience emergency problems such as a toothache or broken tooth, go to the dentist right away. If you see your dentist regularly, you may catch problems early. It is easier to be treated for problems in the early stages.  WHAT TO EXPECT AT A DENTIST VISIT  Your dentist will look for many common  oral health problems and recommend proper treatment. At your regular dental visit, you can expect:  Gentle cleaning of the teeth and gums. This includes scraping and polishing. This helps to remove the sticky substance around the teeth and gums (plaque). Plaque forms in the mouth shortly after eating. Over time, plaque hardens on the teeth as tartar. If tartar is not removed regularly, it can cause problems. Cleaning also helps remove stains.  Periodic X-rays. These pictures of the teeth and supporting bone will help your dentist assess the health of your teeth.  Periodic fluoride treatments. Fluoride is a natural mineral shown to help strengthen teeth. Fluoride treatmentinvolves applying a fluoride gel or varnish to the teeth. It is most commonly done in children.  Examination  of the mouth, tongue, jaws, teeth, and gums to look for any oral health problems, such as:  Cavities (dental caries). This is decay on the tooth caused by plaque, sugar, and acid in the mouth. It is best to catch a cavity when it is small.  Inflammation of the gums caused by plaque buildup (gingivitis).  Problems with the mouth or malformed or misaligned teeth.  Oral cancer or other diseases of the soft tissues or jaws. KEEP YOUR TEETH AND GUMS HEALTHY For healthy teeth and gums, follow these general guidelines as well as your dentist's specific advice:  Have your teeth professionally cleaned at the dentist every 6 months.  Brush twice daily with a fluoride toothpaste.  Floss your teeth daily.  Ask your dentist if you need fluoride supplements, treatments, or fluoride toothpaste.  Eat a healthy diet. Reduce foods and drinks with added sugar.  Avoid smoking. TREATMENT FOR ORAL HEALTH PROBLEMS If you have oral health problems, treatment varies depending on the conditions present in your teeth and gums.  Your caregiver will most likely recommend good oral hygiene at each visit.  For cavities, gingivitis, or other oral health disease, your caregiver will perform a procedure to treat the problem. This is typically done at a separate appointment. Sometimes your caregiver will refer you to another dental specialist for specific tooth problems or for surgery. SEEK IMMEDIATE DENTAL CARE IF:  You have pain, bleeding, or soreness in the gum, tooth, jaw, or mouth area.  A permanent tooth becomes loose or separated from the gum socket.  You experience a blow or injury to the mouth or jaw area. Document Released: 09/24/2010 Document Revised: 04/06/2011 Document Reviewed: 09/24/2010 [facility] CenterExitCare Patient Information 2015 [place]ExitCare, MarylandLLC. This information is not intended to replace advice given to you by your health care provider. Make sure you discuss any questions you have with your health care  provider.

## 2014-02-25 NOTE — ED Provider Notes (Signed)
CSN: [id number]     Arrival date & time 02/25/14  1140 History   First MD Initiated Contact with Patient 02/25/14 1214     Chief Complaint  Patient presents with  . Dental Pain   (Consider location/radiation/quality/duration/timing/severity/associated sxs/prior Treatment) HPI Comments: 3 days of dental pain at left lower molar. Using ibuprofen with some relief. Recently obtained dental insurance but has not yet located provider that accepts her insurance.  Smoker Reports herself to be otherwise healthy. No fever, facial pain, facial swelling.   Patient is a 34 y.o. female presenting with tooth pain. The history is provided by the patient.  Dental Pain   Past Medical History  Diagnosis Date  . Bursitis    History reviewed. No pertinent past surgical history. Family History  Problem Relation Age of Onset  . Diabetes Mother   . Hypertension Father    History  Substance Use Topics  . Smoking status: Current Some Day Smoker  . Smokeless tobacco: Not on file  . Alcohol Use: Yes   OB History    No data available     Review of Systems  All other systems reviewed and are negative.   Allergies  Review of patient's allergies indicates no known allergies.  Home Medications   Prior to Admission medications   Medication Sig Start Date End Date Taking? Authorizing Provider  Multiple Vitamins-Minerals (MULTIVITAMIN PO) Take by mouth.   Yes Historical Provider, MD  lidocaine (XYLOCAINE) 2 % solution Apply to affected area using cotton swab every 3 hours as needed for pain 02/25/14   Ria ClockJennifer Lee H Telford Archambeau, PA  naproxen (NAPROSYN) 500 MG tablet Take 1 tablet (500 mg total) by mouth 2 (two) times daily with a meal. As needed for pain 02/25/14   Ria ClockJennifer Lee H Aaminah Forrester, PA  Prenatal Vit-Fe Fumarate-FA (PNV PRENATAL PLUS MULTIVITAMIN) 27-1 MG TABS Take 1 tablet by mouth daily before breakfast. 04/06/13   Brock Badharles A Harper, MD   BP 119/70 mmHg  Pulse 81  Temp(Src) 98.6 F (37 C) (Oral)   Resp 16  SpO2 97%  LMP 02/23/2014 (Exact Date) Physical Exam  Constitutional: She is oriented to person, place, and time. She appears well-developed and well-nourished. No distress.  HENT:  Head: Normocephalic and atraumatic.  Right Ear: Hearing, tympanic membrane, external ear and ear canal normal.  Left Ear: Hearing, tympanic membrane, external ear and ear canal normal.  Nose: Nose normal.  Mouth/Throat: Uvula is midline and mucous membranes are normal.    No gumline swelling or erythema No facial swelling   Eyes: Conjunctivae are normal. No scleral icterus.  Neck: Normal range of motion. Neck supple.  Cardiovascular: Normal rate, regular rhythm and normal heart sounds.   Pulmonary/Chest: Effort normal and breath sounds normal.  Musculoskeletal: Normal range of motion.  Lymphadenopathy:    She has no cervical adenopathy.  Neurological: She is alert and oriented to person, place, and time.  Skin: Skin is warm and dry.  Psychiatric: She has a normal mood and affect. Her behavior is normal.  Nursing note and vitals reviewed.   ED Course  Procedures (including critical care time) Labs Review Labs Reviewed - No data to display  Imaging Review No results found.   MDM   1. Dental cavity   2. Pain due to dental caries     Naprosyn and viscous lidocaine as prescribed. Advised to contact the member services number listed on her dental insurance card so that they may provide her with list of local  providers that accept her coverage for follow up.     Ria Clock, [place] 02/25/14 1318

## 2014-02-25 NOTE — ED Notes (Signed)
C/O left lower toothache x 3 days without fever.  Has not found dentist yet.  Has tried IBU and raw garlic without any relief.

## 2014-05-14 ENCOUNTER — Telehealth: Payer: Self-pay | Admitting: *Deleted

## 2014-05-14 ENCOUNTER — Encounter (HOSPITAL_COMMUNITY): Payer: Self-pay | Admitting: Neurology

## 2014-05-14 ENCOUNTER — Emergency Department (HOSPITAL_COMMUNITY)
Admission: EM | Admit: 2014-05-14 | Discharge: 2014-05-14 | Disposition: A | Discharge status: Home / Self Care | Payer: BC Managed Care – PPO | Attending: Emergency Medicine | Admitting: Emergency Medicine

## 2014-05-14 DIAGNOSIS — K088 Other specified disorders of teeth and supporting structures: Secondary | ICD-10-CM | POA: Diagnosis not present

## 2014-05-14 DIAGNOSIS — H9209 Otalgia, unspecified ear: Secondary | ICD-10-CM | POA: Diagnosis not present

## 2014-05-14 DIAGNOSIS — K0889 Other specified disorders of teeth and supporting structures: Secondary | ICD-10-CM

## 2014-05-14 DIAGNOSIS — K029 Dental caries, unspecified: Secondary | ICD-10-CM | POA: Insufficient documentation

## 2014-05-14 DIAGNOSIS — Z862 Personal history of diseases of the blood and blood-forming organs and certain disorders involving the immune mechanism: Secondary | ICD-10-CM | POA: Diagnosis not present

## 2014-05-14 DIAGNOSIS — Z8739 Personal history of other diseases of the musculoskeletal system and connective tissue: Secondary | ICD-10-CM | POA: Diagnosis not present

## 2014-05-14 DIAGNOSIS — Z720 Tobacco use: Secondary | ICD-10-CM | POA: Insufficient documentation

## 2014-05-14 DIAGNOSIS — Z791 Long term (current) use of non-steroidal anti-inflammatories (NSAID): Secondary | ICD-10-CM | POA: Diagnosis not present

## 2014-05-14 HISTORY — DX: Sickle-cell trait: D57.3

## 2014-05-14 MED ORDER — NAPROXEN 250 MG PO TABS
500.0000 mg | ORAL_TABLET | Freq: Once | ORAL | Status: AC
  Administered 2014-05-14: 500 mg via ORAL
  Filled 2014-05-14: qty 2

## 2014-05-14 MED ORDER — TRAMADOL HCL 50 MG PO TABS: 50.0000 mg | ORAL_TABLET | Freq: Four times a day (QID) | ORAL | Status: DC | PRN

## 2014-05-14 MED ORDER — PENICILLIN V POTASSIUM 500 MG PO TABS: 500.0000 mg | ORAL_TABLET | Freq: Four times a day (QID) | ORAL | Status: AC

## 2014-05-14 MED ORDER — NAPROXEN 500 MG PO TABS: 500.0000 mg | ORAL_TABLET | Freq: Two times a day (BID) | ORAL | Status: DC

## 2014-05-14 NOTE — Telephone Encounter (Signed)
Patient is interested in a Nexplanon Removal. Patient states she has her Nexplanon inserted 05-17-11. Patient is not interested in having a new one inserted. Patient advised that unfortunately we are not able to do this procedure on the same day as her annual exam. Patient has been scheduled for 04-28-14 @ 1 pm.

## 2014-05-14 NOTE — Discharge Instructions (Signed)
Please follow the directions provided. It is important to follow up with the dentist for further management of your teeth.  Take the naproxen twice a day to help with pain and inflammation. You may use Ultram for pain not relieved by the naproxen. Please be sure to take antibiotic until it is all gone. Don't hesitate to return for any new, worsening, or concerning symptoms.   SEEK IMMEDIATE MEDICAL CARE IF:  You have a fever.  You develop redness and swelling of your face, jaw, or neck.  You are unable to open your mouth.  You have severe pain uncontrolled by pain medicine.   Emergency Department Resource Guide 1) Find a Doctor and Pay Out of Pocket Although you won't have to find out who is covered by your insurance plan, it is a good idea to ask around and get recommendations. You will then need to call the office and see if the doctor you have chosen will accept you as a new patient and what types of options they offer for patients who are self-pay. Some doctors offer discounts or will set up payment plans for their patients who do not have insurance, but you will need to ask so you aren't surprised when you get to your appointment.  2) Contact Your Local Health Department Not all health departments have doctors that can see patients for sick visits, but many do, so it is worth a call to see if yours does. If you don't know where your local health department is, you can check in your phone book. The CDC also has a tool to help you locate your state's health department, and many state websites also have listings of all of their local health departments.  3) Find a Walk-in Clinic If your illness is not likely to be very severe or complicated, you may want to try a walk in clinic. These are popping up all over the country in pharmacies, drugstores, and shopping centers. They're usually staffed by nurse practitioners or physician assistants that have been trained to treat common illnesses and  complaints. They're usually fairly quick and inexpensive. However, if you have serious medical issues or chronic medical problems, these are probably not your best option.  No Primary Care Doctor: - Call Health Connect at  [phone] - they can help you locate a primary care doctor that  accepts your insurance, provides certain services, etc. - Physician Referral Service- [phone]  Chronic Pain Problems: Organization         Address  Phone   Notes  [facility]  [phone] Patients need to be referred by their primary care doctor.   Medication Assistance: Organization         Address  Phone   Notes  [facility] [address] [place], [place] [address] [phone] --Must be a resident of [place] -- Must have NO insurance coverage whatsoever (no Medicaid/ Medicare, etc.) -- The pt. MUST have a primary care doctor that directs their care regularly and follows them in the community   MedAssist  [phone]   [employer]  [phone]    Agencies that provide inexpensive medical care: Organization         Address  Phone   Notes  [facility]  [phone]   [facility]    [phone]) [phone]   [facility] [address] [place], [place] [address] [phone]  [facility] [address] [place] [phone]   Planned Parenthood    [phone]   [facility]    [phone]   [place] and [place] [address], [place] Phone:  [phone], Fax:  [phone] Hours of Operation:  9 am - 6 pm, M-F.  Also accepts Medicaid/Medicare and self-pay.  [facility] for [place] [address], [address], [place] Phone: [phone], Fax: [phone]. Hours of Operation:  8:30 am - 5:30 pm, M-F.  Also accepts Medicaid and self-pay.  [facility] [place] [address] [place] Phone: [phone]   [place], [place], [place] [phone], Ext. [phone] Mondays & Thursdays: 7-9 AM.  First 15 patients are seen on a first come, first serve basis.    [place] Providers:  Organization         Address  Phone   Notes  [facility] [address], [place] [phone] Also accepts self-pay patients.  [facility] [address], [place]  [phone]   [place], [address], [place] [phone]   [facility] [address], [place] [phone]   Lucianne Lei [address], [place]   [phone] Only accepts [place] Access [place] patients after they have their name applied to their card.   Self-Pay (no insurance) in [facility] [place]:  Organization         Address  Phone   Notes  Sickle Cell Patients, [facility] [place] [phone]   [facility] [place] [phone]   [facility] [place]  [place], [place], [place] [phone]   [facility]/Dr. Osei-Bonsu  [address] [place] or [address], [place] [phone] Phone number for both [place] and [place] locations is the same.  Urgent Medical and [facility] [address] [place] [phone]   [facility] [address] [place] or [address] [phone]   [facility] [address], [place] [phone], phone; [phone], fax Sees patients 1st and 3rd Saturday of every month.  Must not qualify for public or private insurance (i.e. Medicaid, Medicare, [place] Health Choice, Veterans' Benefits)  Household income should be no more than 200% of the poverty level  The clinic cannot treat you if you are pregnant or think you are pregnant  Sexually transmitted diseases are not treated at the clinic.    Dental Care: Organization         Address  Phone  Notes  [facility] [place] [phone] Accepts children up to age 97 who are enrolled in [place] or [place]; pregnant women with a Medicaid card; and children who have applied for Medicaid or [place] Health Choice, but were declined, whose parents can pay a reduced fee at time of service.  [facility]  [address], [place] [phone] Accepts children up to age 11 who are enrolled in [place] or [place]; pregnant women with a Medicaid card; and  children who have applied for Medicaid or [place] Health Choice, but were declined, whose parents can pay a reduced fee at time of service.  [place] Adult [facility] Access PROGRAM  [place] [phone] Patients are seen by appointment only. Walk-ins are not accepted. [place] will see patients 65 years of age and older. Monday - Tuesday (8am-5pm) Most Wednesdays (8:30-5pm) $30 per visit, cash only  [facility] Access [facility]  [address], [facility] [phone] Patients are seen by appointment only. Walk-ins are not accepted. [place] will see patients 17 years of age and older. One Wednesday Evening (Monthly: Volunteer Based).  $30 per visit, cash only  [place]  [phone] for adults; Children under age 53, call Graduate Pediatric Dentistry at [phone]. Children aged 31-14, please call [phone] to request a pediatric application.  Dental services are provided in all areas of dental care including fillings, crowns and bridges, complete and partial dentures, implants, gum treatment, root canals, and extractions. Preventive care is  also provided. Treatment is provided to both adults and children. Patients are selected via a lottery and there is often a waiting list.   [facility] [address] [place]  [phone] [email or web address]   [facility] [address] [place], [place] [phone], Ext. [phone] Second and Fourth Thursday of each month, opens at 6:30 AM; Clinic ends at 9 AM.  Patients are seen on a first-come first-served basis, and a limited number are seen during each clinic.   [facility]  [address] [place], [place] [phone]   Eligibility Requirements You must have lived in [place], [place], or [place] counties for at least the last three months.   You cannot be eligible for state or federal sponsored [employer], including [employer], [place], or [employer].   You generally cannot be eligible for healthcare insurance through your employer.    How to apply: Eligibility screenings are held every Tuesday and Wednesday afternoon from 1:00 pm until 4:00 pm. You do not need an appointment for the interview!  [facility] [address] [place], [place]   [place]  [place] Department  [place]  [phone]    Behavioral Health Resources in the Community: Intensive Outpatient Programs Organization         Address  Phone  Notes  [place]. [address] [place], [place] [phone]   [facility]patient [address], [place], [place]   ADS: Alcohol & Drug Svcs [address] [place], [place]   [place] [address],  [place], [place] or [phone]   Substance Abuse Resources Organization         Address  Phone  Notes  Alcohol and Drug Services  [phone]   [place]  [phone]   The [place]   Chinita Pester  [phone]   Residential & Outpatient Substance Abuse Program  [phone]   Psychological Services Organization         Address  Phone  Notes  [facility] [place]  [place]  [phone]   [place] [address], [place] or [phone]    Mobile Crisis Teams Organization  Address  Phone  Notes  [facility], Mobile Crisis Care Unit  [phone]   [facility]  [address] [place], [place] [phone]   [facility] [address] [place] [phone]    Self-Help/Support Groups Organization         Address  Phone             Notes  [facility] [place] - variety of support groups  [phone] Call for more information  Narcotics Anonymous (NA), [facility] [address], [place]  2 meetings at this location   Statisticianesidential Treatment Programs Organization         Address  Phone  Notes  [facility] [address],    [place]  [phone]   [facility]  [address], [place] [phone], [place], [place] [phone]   [facility] [address], [place] [phone] Admissions: 8am-3pm M-F  [facility] [address]    [place], [place] [phone]   [facility] [address], [place], [place] [phone]   [address] [facility] [address]  [place], [place] [phone]   [facility] - Intensive Outpatient [address], [place], [place] [phone]   [facility] (Addiction Recovery Care [facility] [address]  [place], [place] [phone] or [phone]   [facility] ([facility]) [address] [place], [place] [phone] Accepts Medicaid  [facility] [address]  [place] [phone]  Substance Abuse/Addiction Treatment   [facility] Behavioral Health Resources Organization         Address  Phone  Notes  [facility]  [phone]   Angie FavaJulie Brannon, PhD [address], [address] [place], [place]   [phone]) [phone] or [phone]   [facility]   [address] [place], [place] [phone]   [facility] [address], [place], [place] [phone] Insurance/Medicaid/sponsorship through [facility]  Faith and Families [address]                                    [place], [place] [phone] Therapy/tele-psych/case  [facility] [address].   [place], [place] [phone]) [phone]    Dr. Lolly MustacheArfeen  [phone]) [phone]   [facility]  [employer] [facility] 1) [address], [place] 2) [address], [place] 3)  [address], [place] [phone]) [phone]  [phone]   [facility] Child Abuse Hotline [phone] or [phone]) [phone] (After Hours)

## 2014-05-14 NOTE — ED Notes (Signed)
Pt reports right ear pain for 2 days. Also right sided dental pain and thinks she has an abscess.

## 2014-05-14 NOTE — ED Provider Notes (Signed)
CSN: [id number]     Arrival date & time 05/14/14  9147 History   First MD Initiated Contact with Patient 05/14/14 0719     Chief Complaint  Patient presents with  . Otalgia  . Dental Pain   (Consider location/radiation/quality/duration/timing/severity/associated sxs/prior Treatment) HPI  Robin Duncan is a 34 year old female presenting with right ear pain and dental pain. She states she's had ongoing dental problems with both right upper and lower back teeth. 2 days ago she reports pain radiating to her ear. She rates her pain as an 8 out of 10 and describes it as aching. She denies any fevers, chills, nausea, vomiting, headaches or trouble eating or swallowing.   Past Medical History  Diagnosis Date  . Bursitis   . Sickle cell trait    History reviewed. No pertinent past surgical history. Family History  Problem Relation Age of Onset  . Diabetes Mother   . Hypertension Father    History  Substance Use Topics  . Smoking status: Current Some Day Smoker  . Smokeless tobacco: Not on file  . Alcohol Use: Yes   OB History    No data available     Review of Systems  Constitutional: Negative for fever.  HENT: Positive for dental problem and ear pain.   Gastrointestinal: Negative for nausea and vomiting.  Musculoskeletal: Negative for neck pain.  Skin: Negative for rash.      Allergies  Review of patient's allergies indicates no known allergies.  Home Medications   Prior to Admission medications   Medication Sig Start Date End Date Taking? Authorizing Provider  lidocaine (XYLOCAINE) 2 % solution Apply to affected area using cotton swab every 3 hours as needed for pain 02/25/14   Ria Clock, PA  Multiple Vitamins-Minerals (MULTIVITAMIN PO) Take by mouth.    Historical Provider, MD  naproxen (NAPROSYN) 500 MG tablet Take 1 tablet (500 mg total) by mouth 2 (two) times daily with a meal. As needed for pain 02/25/14   Ria Clock, PA  Prenatal Vit-Fe  Fumarate-FA (PNV PRENATAL PLUS MULTIVITAMIN) 27-1 MG TABS Take 1 tablet by mouth daily before breakfast. 04/06/13   Brock Bad, MD   BP 125/77 mmHg  Pulse 84  Temp(Src) 98.3 F (36.8 C) (Oral)  Resp 14  SpO2 100%  LMP 05/04/2014 Physical Exam  Constitutional: She is oriented to person, place, and time. She appears well-developed and well-nourished. No distress.  HENT:  Head: Normocephalic and atraumatic.  Mouth/Throat: No trismus in the jaw. Abnormal dentition. Dental caries present. No dental abscesses or uvula swelling. No oropharyngeal exudate.  Fractured dentition over upper and lower right molars.   Eyes: Conjunctivae are normal.  Neck: Neck supple.  Cardiovascular: Normal rate and regular rhythm.   Pulmonary/Chest: Effort normal.  Lymphadenopathy:    She has no cervical adenopathy.  Neurological: She is alert and oriented to person, place, and time.  Skin: Skin is warm and dry. No rash noted. She is not diaphoretic.  Psychiatric: She has a normal mood and affect.  Nursing note and vitals reviewed.   ED Course  Procedures (including critical care time) Labs Review Labs Reviewed - No data to display  Imaging Review No results found.   EKG Interpretation None      MDM   Final diagnoses:  Pain, dental   34 yo with right sided toothache and associated right ear pain. She has poor dentition but no evidence of a gross abscess, Ludwig's angina or spread  of infection. Prescription for penicillin, NSAIDs and ultram provided and resources to establish care with a dentist given. Pt is well-appearing, in no acute distress and vital signs reviewed and not concerning. She appears safe to be discharged.  Return precautions provided. Pt aware of plan and in agreement.       Filed Vitals:   05/14/14 0706 05/14/14 0730 05/14/14 0818  BP: 125/77 125/70 128/76  Pulse: 84 79 70  Temp: 98.3 F (36.8 C)    TempSrc: Oral    Resp: 14    SpO2: 100% 100% 100%   Meds given in  ED:  Medications  naproxen (NAPROSYN) tablet 500 mg (500 mg Oral Given 05/14/14 0752)    Discharge Medication List as of 05/14/2014  8:08 AM    START taking these medications   Details  !! naproxen (NAPROSYN) 500 MG tablet Take 1 tablet (500 mg total) by mouth 2 (two) times daily., Starting 05/14/2014, Until Discontinued, Print    penicillin v potassium (VEETID) 500 MG tablet Take 1 tablet (500 mg total) by mouth 4 (four) times daily., Starting 05/14/2014, Until Mon 05/21/14, Print    traMADol (ULTRAM) 50 MG tablet Take 1 tablet (50 mg total) by mouth every 6 (six) hours as needed., Starting 05/14/2014, Until Discontinued, Print     !! - Potential duplicate medications found. Please discuss with provider.         Harle BattiestElizabeth Anthonymichael Munday, NP 05/15/14 1215  Lorre NickAnthony Allen, MD 05/16/14 1006

## 2014-05-17 ENCOUNTER — Ambulatory Visit (INDEPENDENT_AMBULATORY_CARE_PROVIDER_SITE_OTHER): Payer: BC Managed Care – PPO | Admitting: Obstetrics

## 2014-05-17 ENCOUNTER — Encounter: Payer: Self-pay | Admitting: Obstetrics

## 2014-05-17 VITALS — BP 117/71 | HR 90 | Temp 97.8°F | Ht 64.0 in | Wt 210.0 lb

## 2014-05-17 DIAGNOSIS — Z124 Encounter for screening for malignant neoplasm of cervix: Secondary | ICD-10-CM | POA: Diagnosis not present

## 2014-05-17 DIAGNOSIS — Z3009 Encounter for other general counseling and advice on contraception: Secondary | ICD-10-CM

## 2014-05-17 DIAGNOSIS — Z01419 Encounter for gynecological examination (general) (routine) without abnormal findings: Secondary | ICD-10-CM

## 2014-05-17 DIAGNOSIS — Z0000 Encounter for general adult medical examination without abnormal findings: Secondary | ICD-10-CM | POA: Diagnosis not present

## 2014-05-17 MED ORDER — PNV PRENATAL PLUS MULTIVITAMIN 27-1 MG PO TABS: 1.0000 | ORAL_TABLET | Freq: Every day | ORAL | Status: DC

## 2014-05-17 NOTE — Progress Notes (Signed)
Subjective:        Robin Duncan is a 34 y.o. female here for a routine exam.  Current complaints: None.  Presents for routine annual.    Personal health questionnaire:  Is patient Ashkenazi Jewish, have a family history of breast and/or ovarian cancer: no Is there a family history of uterine cancer diagnosed at age < 4950, gastrointestinal cancer, urinary tract cancer, family member who is a Personnel officerLynch syndrome-associated carrier: no Is the patient overweight and hypertensive, family history of diabetes, personal history of gestational diabetes, preeclampsia or PCOS: no Is patient over 4155, have PCOS,  family history of premature CHD under age 36, diabetes, smoke, have hypertension or peripheral artery disease:  no At any time, has a partner hit, kicked or otherwise hurt or frightened you?: no Over the past 2 weeks, have you felt down, depressed or hopeless?: no Over the past 2 weeks, have you felt little interest or pleasure in doing things?:no   Gynecologic History Patient's last menstrual period was 05/04/2014. Contraception: Nexplanon Last Pap: 2015. Results were: normal Last mammogram: n/a. Results were: n/a  Obstetric History OB History  No data available    Past Medical History  Diagnosis Date  . Bursitis   . Sickle cell trait     History reviewed. No pertinent past surgical history.   Current outpatient prescriptions:  .  penicillin v potassium (VEETID) 500 MG tablet, Take 1 tablet (500 mg total) by mouth 4 (four) times daily., Disp: 40 tablet, Rfl: 0 .  Prenatal Vit-Fe Fumarate-FA (PNV PRENATAL PLUS MULTIVITAMIN) 27-1 MG TABS, Take 1 tablet by mouth daily before breakfast., Disp: 30 tablet, Rfl: 11 No Known Allergies  History  Substance Use Topics  . Smoking status: Current Some Day Smoker  . Smokeless tobacco: Not on file  . Alcohol Use: 0.0 oz/week    0 Standard drinks or equivalent per week    Family History  Problem Relation Age of Onset  . Diabetes  Mother   . Hypertension Father       Review of Systems  Constitutional: negative for fatigue and weight loss Respiratory: negative for cough and wheezing Cardiovascular: negative for chest pain, fatigue and palpitations Gastrointestinal: negative for abdominal pain and change in bowel habits Musculoskeletal:negative for myalgias Neurological: negative for gait problems and tremors Behavioral/Psych: negative for abusive relationship, depression Endocrine: negative for temperature intolerance   Genitourinary:negative for abnormal menstrual periods, genital lesions, hot flashes, sexual problems and vaginal discharge Integument/breast: negative for breast lump, breast tenderness, nipple discharge and skin lesion(s)    Objective:       BP 117/71 mmHg  Pulse 90  Temp(Src) 97.8 F (36.6 C)  Ht 5\' 4"  (1.626 m)  Wt 210 lb (95.255 kg)  BMI 36.03 kg/m2  LMP 05/04/2014 General:   alert  Skin:   no rash or abnormalities  Lungs:   clear to auscultation bilaterally  Heart:   regular rate and rhythm, S1, S2 normal, no murmur, click, rub or gallop  Breasts:   normal without suspicious masses, skin or nipple changes or axillary nodes  Abdomen:  normal findings: no organomegaly, soft, non-tender and no hernia  Pelvis:  External genitalia: normal general appearance Urinary system: urethral meatus normal and bladder without fullness, nontender Vaginal: normal without tenderness, induration or masses Cervix: normal appearance Adnexa: normal bimanual exam Uterus: anteverted and non-tender, normal size   Lab Review Urine pregnancy test Labs reviewed yes Radiologic studies reviewed yes    Assessment:  Healthy female exam.    Contraceptive Management.  Nexplanon expiring this year.  Considering IUD ( ParaGuard )    Plan:    Education reviewed: calcium supplements, low fat, low cholesterol diet, safe sex/STD prevention, self breast exams and weight bearing exercise. Contraception:  Nexplanon. Follow up in: 1 year.   Meds ordered this encounter  Medications  . Prenatal Vit-Fe Fumarate-FA (PNV PRENATAL PLUS MULTIVITAMIN) 27-1 MG TABS    Sig: Take 1 tablet by mouth daily before breakfast.    Dispense:  30 tablet    Refill:  11   Orders Placed This Encounter  Procedures  . SureSwab, Vaginosis/Vaginitis Plus

## 2014-05-21 LAB — PAP IG AND HPV HIGH-RISK: HPV DNA HIGH RISK: DETECTED — AB

## 2014-05-21 LAB — SURESWAB, VAGINOSIS/VAGINITIS PLUS
ATOPOBIUM VAGINAE: NOT DETECTED Log (cells/mL)
C. ALBICANS, DNA: NOT DETECTED
C. GLABRATA, DNA: NOT DETECTED
C. TRACHOMATIS RNA, TMA: NOT DETECTED
C. parapsilosis, DNA: NOT DETECTED
C. tropicalis, DNA: NOT DETECTED
GARDNERELLA VAGINALIS: NOT DETECTED Log (cells/mL)
LACTOBACILLUS SPECIES: >8 Log (cells/mL)
MEGASPHAERA SPECIES: NOT DETECTED Log (cells/mL)
N. gonorrhoeae RNA, TMA: NOT DETECTED
T. vaginalis RNA, QL TMA: NOT DETECTED

## 2014-05-23 ENCOUNTER — Ambulatory Visit: Payer: Self-pay | Admitting: Obstetrics

## 2014-06-07 ENCOUNTER — Ambulatory Visit: Payer: Self-pay | Admitting: Obstetrics

## 2014-07-09 ENCOUNTER — Encounter: Payer: Self-pay | Admitting: Obstetrics

## 2014-07-09 ENCOUNTER — Telehealth: Payer: Self-pay | Admitting: *Deleted

## 2014-07-09 NOTE — Telephone Encounter (Signed)
Patient has been rescheduled for her colposcopy. Patient is scheduled for 07-24-14 @ 10:45 am.

## 2014-07-24 ENCOUNTER — Other Ambulatory Visit: Payer: Self-pay | Admitting: Obstetrics

## 2014-07-24 ENCOUNTER — Ambulatory Visit (INDEPENDENT_AMBULATORY_CARE_PROVIDER_SITE_OTHER): Payer: BC Managed Care – PPO | Admitting: Obstetrics

## 2014-07-24 ENCOUNTER — Encounter: Payer: Self-pay | Admitting: Obstetrics

## 2014-07-24 VITALS — BP 120/80 | HR 76 | Temp 98.5°F | Ht 64.0 in | Wt 212.0 lb

## 2014-07-24 DIAGNOSIS — Z3202 Encounter for pregnancy test, result negative: Secondary | ICD-10-CM | POA: Diagnosis not present

## 2014-07-24 DIAGNOSIS — B3731 Acute candidiasis of vulva and vagina: Secondary | ICD-10-CM

## 2014-07-24 DIAGNOSIS — Z538 Procedure and treatment not carried out for other reasons: Secondary | ICD-10-CM

## 2014-07-24 DIAGNOSIS — R87612 Low grade squamous intraepithelial lesion on cytologic smear of cervix (LGSIL): Secondary | ICD-10-CM

## 2014-07-24 DIAGNOSIS — B9689 Other specified bacterial agents as the cause of diseases classified elsewhere: Secondary | ICD-10-CM

## 2014-07-24 DIAGNOSIS — B373 Candidiasis of vulva and vagina: Secondary | ICD-10-CM

## 2014-07-24 DIAGNOSIS — N760 Acute vaginitis: Secondary | ICD-10-CM

## 2014-07-24 LAB — POCT URINE PREGNANCY: Preg Test, Ur: NEGATIVE

## 2014-07-24 MED ORDER — TINIDAZOLE 500 MG PO TABS: 1000.0000 mg | ORAL_TABLET | Freq: Every day | ORAL | Status: DC

## 2014-07-24 MED ORDER — FLUCONAZOLE 150 MG PO TABS: 150.0000 mg | ORAL_TABLET | Freq: Once | ORAL | Status: DC

## 2014-07-24 NOTE — Patient Instructions (Addendum)
Colposcopy Colposcopy is a procedure to examine your cervix and vagina, or the area around the outside of your vagina, for abnormalities or signs of disease. The procedure is done using a lighted microscope called a colposcope. Tissue samples may be collected during the colposcopy if your health care provider finds any unusual cells. A colposcopy may be done if a woman has:  An abnormal Pap test. A Pap test is a medical test done to evaluate cells that are on the surface of the cervix.  A Pap test result that is suggestive of human papillomavirus (HPV). This virus can cause genital warts and is linked to the development of cervical cancer.  A sore on her cervix and the results of a Pap test were normal.  Genital warts on the cervix or in or around the outside of the vagina.  A mother who took the drug diethylstilbestrol (DES) while pregnant.  Painful intercourse.  Vaginal bleeding, especially after sexual intercourse. [facility] PROVIDER KNOW ABOUT:  Any allergies you have.  All medicines you are taking, including vitamins, herbs, eye drops, creams, and over-the-counter medicines.  Previous problems you or members of your family have had with the use of anesthetics.  Any blood disorders you have.  Previous surgeries you have had.  Medical conditions you have. RISKS AND COMPLICATIONS Generally, a colposcopy is a safe procedure. However, as with any procedure, complications can occur. Possible complications include:  Bleeding.  Infection.  Missed lesions. BEFORE THE PROCEDURE   Tell your health care provider if you have your menstrual period. A colposcopy typically is not done during menstruation.  For 24 hours before the colposcopy, do not:  Douche.  Use tampons.  Use medicines, creams, or suppositories in the vagina.  Have sexual intercourse. PROCEDURE  During the procedure, you will be lying on your back with your feet in foot rests (stirrups). A warm  metal or plastic instrument (speculum) will be placed in your vagina to keep it open and to allow the health care provider to see the cervix. The colposcope will be placed outside the vagina. It will be used to magnify and examine the cervix, vagina, and the area around the outside of the vagina. A small amount of liquid solution will be placed on the area that is to be viewed. This solution will make it easier to see the abnormal cells. Your health care provider will use tools to suck out mucus and cells from the canal of the cervix. Then he or she will record the location of the abnormal areas. If a biopsy is done during the procedure, a medicine will usually be given to numb the area (local anesthetic). You may feel mild pain or cramping while the biopsy is done. After the procedure, tissue samples collected during the biopsy will be sent to a lab for analysis. AFTER THE PROCEDURE  You will be given instructions on when to follow up with your health care provider for your test results. It is important to keep your appointment. Document Released: 04/04/2002 Document Revised: 09/14/2012 Document Reviewed: 08/11/2012 [facility] Patient Information 2015 [place], [place]. This information is not intended to replace advice given to you by your health care provider. Make sure you discuss any questions you have with your health care provider. Colposcopy, Care After Refer to this sheet in the next few weeks. These instructions provide you with information on caring for yourself after your procedure. Your health care provider may also give you more specific instructions. Your treatment  has been planned according to current medical practices, but problems sometimes occur. Call your health care provider if you have any problems or questions after your procedure. WHAT TO EXPECT AFTER THE PROCEDURE  After your procedure, it is typical to have the following:  Cramping. This often goes away in a few minutes.  Soreness.  This may last for 2 days.  Lightheadedness. Lie down for a few minutes if this occurs. You may also have some bleeding or dark discharge for a few days. You may need to wear a sanitary pad during this time. HOME CARE INSTRUCTIONS  Avoid sex, douching, and using tampons for 3 days or as directed by your health care provider.  Only take over-the-counter or prescription medicines as directed by your health care provider. Do not take aspirin because it can cause bleeding.  Continue to take birth control pills if you are on them.  Not all test results are available during your visit. If your test results are not back during the visit, make an appointment with your health care provider to find out the results. Do not assume everything is normal if you have not heard from your health care provider or the medical facility. It is important for you to follow up on all of your test results.  Follow your health care provider's advice regarding activity, follow-up visits, and follow-up Pap tests. SEEK MEDICAL CARE IF:  You develop a rash.  You have problems with your medicine. SEEK IMMEDIATE MEDICAL CARE IF:  You are bleeding heavily or are passing blood clots.  You have a fever.  You have abnormal vaginal discharge.  You are having cramps that do not go away after taking your pain medicine.  You feel lightheaded, dizzy, or faint.  You have stomach pain. Document Released: 11/02/2012 Document Reviewed: 11/02/2012 [facility] Patient Information 2015 [place], [place]. This information is not intended to replace advice given to you by your health care provider. Make sure you discuss any questions you have with your health care provider. Human Papillomavirus Human papillomavirus (HPV) is the most common sexually transmitted infection (STI) and is highly contagious. HPV infections cause genital warts and cancers to the outlet of the womb (cervix), birth canal (vagina), opening of the birth canal  (vulva), and anus. There are over 100 types of HPV. Four types of HPV are responsible for causing 70% of all cervical cancers. Ninety percent of anal cancers and genital warts are caused by HPV. Unless you have wart-like lesions in the throat or genital warts that you can see or feel, HPV usually does not cause symptoms. Therefore, people can be infected for long periods and pass it on to others without knowing it. HPV in pregnancy usually does not cause a problem for the mother or baby. If the mother has genital warts, the baby rarely gets infected. When the HPV infection is found to be pre-cancerous on the cervix, vagina, or vulva, the mother will be followed closely during the pregnancy. Any needed treatment will be done after the baby is born. CAUSES   Having unprotected sex. HPV can be spread by oral, vaginal, or anal sexual activity.  Having several sex partners.  Having a sex partner who has other sex partners.  Having or having had another sexually transmitted infection. SYMPTOMS   More than 90% of people carrying HPV cannot tell anything is wrong.  Wart-like lesions in the throat (from having oral sex).  Warts in the infected skin or mucous membranes.  Genital warts may  itch, burn, or bleed.  Genital warts may be painful or bleed during sexual intercourse. DIAGNOSIS   Genital warts are easily seen with the naked eye.  Currently, there is no FDA-approved test to detect HPV in males.  In females, a Pap test can show cells which are infected with HPV.  In females, a scope can be used to view the cervix (colposcopy). A colposcopy can be performed if the pelvic exam or Pap test is abnormal.  In females, a sample of tissue may be removed (biopsy) during the colposcopy. TREATMENT   Treatment of genital warts can include:  Podophyllin lotion or gel.  Bichloroacetic acid (BCA) or trichloroacetic acid (TCA).  Podofilox solution or gel.  Imiquimod cream.  Interferon  injections.  Use of a probe to apply extreme cold (cryotherapy).  Application of an intense beam of light (laser treatment).  Use of a probe to apply extreme heat (electrocautery).  Surgery.  HPV of the cervix, vagina, or vulva can be treated with:  Cryotherapy.  Laser treatment.  Electrocautery.  Surgery. Your caregiver will follow you closely after you are treated. This is because the HPV can come back and may need treatment again. HOME CARE INSTRUCTIONS   Follow your caregiver's instructions regarding medications, Pap tests, and follow-up exams.  Do not touch or scratch the warts.  Do not treat genital warts with medication used for treating hand warts.  Tell your sex partner about your infection because he or she may also need treatment.  Do not have sex while you are being treated.  After treatment, use condoms during sex to prevent future infections.  Have only 1 sex partner.  Have a sex partner who does not have other sex partners.  Use over-the-counter creams for itching or irritation as directed by your caregiver.  Use over-the-counter or prescription medicines for pain, discomfort, or fever as directed by your caregiver.  Do not douche or use tampons during treatment of HPV. PREVENTION   Talk to your caregiver about getting the HPV vaccines. These vaccines prevent some HPV infections and cancers. It is recommended that the vaccine be given to males and females between the age of 58 and 40 years old. It will not work if you already have HPV and it is not recommended for pregnant women. The vaccines are not recommended for pregnant women.  Call your caregiver if you think you are pregnant and have the HPV.  A PAP test is done to screen for cervical cancer.  The first PAP test should be done at age 50.  Between ages 20 and 74, PAP tests are repeated every 2 years.  Beginning at age 43, you are advised to have a PAP test every 3 years as long as your past 3  PAP tests have been normal.  Some women have medical problems that increase the chance of getting cervical cancer. Talk to your caregiver about these problems. It is especially important to talk to your caregiver if a new problem develops soon after your last PAP test. In these cases, your caregiver may recommend more frequent screening and Pap tests.  The above recommendations are the same for women who have or have not gotten the vaccine for HPV (Human Papillomavirus).  If you had a hysterectomy for a problem that was not a cancer or a condition that could lead to cancer, then you no longer need Pap tests. However, even if you no longer need a PAP test, a regular exam is a good idea  to make sure no other problems are starting.   If you are between ages 36 and 32, and you have had normal Pap tests going back 10 years, you no longer need Pap tests. However, even if you no longer need a PAP test, a regular exam is a good idea to make sure no other problems are starting.  If you have had past treatment for cervical cancer or a condition that could lead to cancer, you need Pap tests and screening for cancer for at least 20 years after your treatment.  If Pap tests have been discontinued, risk factors (such as a new sexual partner)need to be re-assessed to determine if screening should be resumed.  Some women may need screenings more often if they are at high risk for cervical cancer. SEEK MEDICAL CARE IF:   The treated skin becomes red, swollen or painful.  You have an oral temperature above 102 F (38.9 C).  You feel generally ill.  You feel lumps or pimple-like projections in and around your genital area.  You develop bleeding of the vagina or the treatment area.  You develop painful sexual intercourse. Document Released: 04/04/2003 Document Revised: 04/06/2011 Document Reviewed: 04/19/2013 [facility] Patient Information 2015 [place], [place]. This information is not intended to replace  advice given to you by your health care provider. Make sure you discuss any questions you have with your health care provider.

## 2014-07-24 NOTE — Addendum Note (Signed)
Addended by: Marya LandryFOSTER, Yuan Gann D on: 07/24/2014 02:11 PM   Modules accepted: Orders

## 2014-07-24 NOTE — Addendum Note (Signed)
Addended by: Clearnce HastenJOHNSON, LATIA on: 07/24/2014 12:01 PM   Modules accepted: Orders

## 2014-07-24 NOTE — Progress Notes (Signed)
Colposcopy Procedure Note  Indications: Pap smear 2 months ago showed: low-grade squamous intraepithelial neoplasia (LGSIL - encompassing HPV,mild dysplasia,CIN I). The prior pap showed low-grade squamous intraepithelial neoplasia (LGSIL - encompassing HPV,mild dysplasia,CIN I).  Prior cervical/vaginal disease: normal exam without visible pathology. Prior cervical treatment: no treatment.  Procedure Details  The risks and benefits of the procedure and Written informed consent obtained.  A time-out was performed confirming the patient, procedure and allergy status  Speculum placed in vagina and excellent visualization of cervix achieved, cervix swabbed x 3 with acetic acid solution.  Findings: Cervix: no visible lesions, no mosaicism, no punctation and no abnormal vasculature; endocervical curettage performed, cervical biopsies taken at 6 and 10 o'clock, specimen labelled and sent to pathology and hemostasis achieved with silver nitrate.   Vaginal inspection: normal without visible lesions. Vulvar colposcopy: vulvar colposcopy not performed.   Physical Exam   Specimens: ECC and Cervical Biopsies  Complications: none.  Plan: Specimens labelled and sent to Pathology. Will base further treatment on Pathology findings. Post biopsy instructions given to patient. Return to discuss Pathology results in 2 weeks.

## 2014-07-27 ENCOUNTER — Other Ambulatory Visit: Payer: Self-pay | Admitting: Obstetrics

## 2014-07-27 LAB — SURESWAB, VAGINOSIS/VAGINITIS PLUS
ATOPOBIUM VAGINAE: NOT DETECTED Log (cells/mL)
BV CATEGORY: UNDETERMINED — AB
C. GLABRATA, DNA: NOT DETECTED
C. TROPICALIS, DNA: NOT DETECTED
C. albicans, DNA: NOT DETECTED
C. parapsilosis, DNA: NOT DETECTED
C. trachomatis RNA, TMA: NOT DETECTED
GARDNERELLA VAGINALIS: 6.3 Log (cells/mL)
LACTOBACILLUS SPECIES: 5.2 Log (cells/mL)
N. gonorrhoeae RNA, TMA: NOT DETECTED
T. VAGINALIS RNA, QL TMA: NOT DETECTED

## 2014-08-07 ENCOUNTER — Ambulatory Visit: Payer: BC Managed Care – PPO | Admitting: Obstetrics

## 2014-12-17 ENCOUNTER — Ambulatory Visit: Payer: Self-pay | Admitting: Obstetrics

## 2014-12-24 ENCOUNTER — Ambulatory Visit (INDEPENDENT_AMBULATORY_CARE_PROVIDER_SITE_OTHER): Payer: BC Managed Care – PPO | Admitting: Certified Nurse Midwife

## 2014-12-24 VITALS — BP 111/78 | HR 96 | Wt 214.0 lb

## 2014-12-24 DIAGNOSIS — Z3046 Encounter for surveillance of implantable subdermal contraceptive: Secondary | ICD-10-CM

## 2014-12-24 NOTE — Patient Instructions (Signed)
Human Papillomavirus Human papillomavirus (HPV) is the most common sexually transmitted infection (STI) and is highly contagious. HPV infections cause genital warts and cancers to the outlet of the womb (cervix), birth canal (vagina), opening of the birth canal (vulva), and anus. There are over 100 types of HPV. Unless wartlike lesions are present in the throat or there are genital warts that you can see or feel, HPV usually does not cause symptoms. It is possible to be infected for long periods and pass it on to others without knowing it. CAUSES  HPV is spread from person to person through sexual contact. This includes oral, vaginal, or anal sex. RISK FACTORS  Having unprotected sex. HPV can be spread by oral, vaginal, or anal sex.  Having several sex partners.  Having a sex partner who has other sex partners.  Having or having had another sexually transmitted infection. SIGNS AND SYMPTOMS  Most people carrying HPV do not have any symptoms. If symptoms are present, symptoms may include:  Wartlike lesions in the throat (from having oral sex).  Warts in the infected skin or mucous membranes.  Genital warts that may itch, burn, or bleed.  Genital warts that may be painful or bleed during sexual intercourse. DIAGNOSIS  If wartlike lesions are present in the throat or genital warts are present, your health care provider can usually diagnose HPV by physical examination.   Genital warts are easily seen with the naked eye.  Currently, there is no FDA-approved test to detect HPV in males.  In females, a Pap test can show cells that are infected with HPV.  In females, a scope can be used to view the cervix (colposcopy). A colposcopy can be performed if the pelvic exam or Pap test is abnormal. A sample of tissue may be removed (biopsy) during the colposcopy. TREATMENT  There is no treatment for the virus itself. However, there are treatments for the health problems and symptoms HPV can cause.  Your health care provider will follow you closely after you are treated. This is because the HPV can come back and may need treatment again. Treatment of HPV may include:   Medicines, which may be injected or applied in a cream, lotion, or gel form.  Use of a probe to apply extreme cold (cryotherapy).  Application of an intense beam of light (laser treatment).  Use of a probe to apply extreme heat (electrocautery).  Surgery. HOME CARE INSTRUCTIONS   Take medicines only as directed by your health care provider.  Use over-the-counter creams for itching or irritation as directed by your health care provider.  Keep all follow-up visits as directed by your health care provider. This is important.  Do not touch or scratch the warts.  Do not treat genital warts with medicines used for treating hand warts.  Do not have sex while you are being treated.  Do not douche or use tampons during treatment of HPV.  Tell your sex partner about your infection because he or she may also need treatment.  If you become pregnant, tell your health care provider that you have had HPV. Your health care provider will monitor you closely during pregnancy to be sure your baby is safe.  After treatment, use condoms during sex to prevent future infections.  Have only one sex partner.  Have a sex partner who does not have other sex partners. PREVENTION   Talk to your health care provider about getting the HPV vaccines. These vaccines prevent some HPV infections and cancers.   It is recommended that the vaccine be given to males and females between the ages of 9 and 26 years old. It will not work if you already have HPV, and it is not recommended for pregnant women.  A Pap test is done to screen for cervical cancer in women.  The first Pap test should be done at age 21 years.  Between ages 21 and 29 years, Pap tests are repeated every 2 years.  Beginning at age 30, you are advised to have a Pap test every  3 years as long as your past 3 Pap tests have been normal.  Some women have medical problems that increase the chance of getting cervical cancer. Talk to your health care provider about these problems. It is especially important to talk to your health care provider if a new problem develops soon after your last Pap test. In these cases, your health care provider may recommend more frequent screening and Pap tests.  The above recommendations are the same for women who have or have not gotten the vaccine for HPV.  If you had a hysterectomy for a problem that was not a cancer or a condition that could lead to cancer, then you no longer need Pap tests. However, even if you no longer need a Pap test, a regular exam is a good idea to make sure no other problems are starting.   If you are between the ages of 65 and 70 years and you have had normal Pap tests going back 10 years, you no longer need Pap tests. However, even if you no longer need a Pap test, a regular exam is a good idea to make sure no other problems are starting.  If you have had past treatment for cervical cancer or a condition that could lead to cancer, you need Pap tests and screening for cancer for at least 20 years after your treatment.  If Pap tests have been discontinued, risk factors (such as a new sexual partner)need to be reassessed to determine if screening should be resumed.  Some women may need screenings more often if they are at high risk for cervical cancer. SEEK MEDICAL CARE IF:   The treated skin becomes red, swollen, or painful.  You have a fever.  You feel generally ill.  You feel lumps or pimple-like projections in and around your genital area.  You develop bleeding of the vagina or the treatment area.  You have painful sexual intercourse. MAKE SURE YOU:   Understand these instructions.  Will watch your condition.  Will get help if you are not doing well or get worse.   This information is not  intended to replace advice given to you by your health care provider. Make sure you discuss any questions you have with your health care provider.   Document Released: 04/04/2003 Document Revised: 02/02/2014 Document Reviewed: 04/19/2013 Elsevier Interactive Patient Education 2016 [employer]  

## 2014-12-25 ENCOUNTER — Encounter: Payer: Self-pay | Admitting: Certified Nurse Midwife

## 2014-12-25 NOTE — Progress Notes (Signed)
Patient ID: Robin ScoreCherrie L Duncan, female   DOB: 06-Sep-1980, 34 y.o.   MRN: [id number]  NEXPLANON REMOVAL   Reasons  for removal:  Expired device.  Declines any birth control at this time, is not currently sexually active.     A timeout was performed confirming the patient, the procedure and allergy status. The patient's left  arm was palpated and the implant device located. The area was prepped with Betadinex3. The distal end of the device was palpated and 3 cc of 1% lidocaine was injected. A 2 mm incision was made. Any fibrotic tissue was carefully dissected away using blunt and/or sharp dissection. The device was removed in an intact manner. Steri-strips and a sterile dressing were applied to the incision. The patient tolerated the procedure well.  New contraceptive method: no method  Is due for annual exam with pap smear in April, had colpo about 5 months ago.  Declines pap smear to day.  Dr. Clearance CootsHarper made aware of POC.

## 2015-05-20 ENCOUNTER — Ambulatory Visit (INDEPENDENT_AMBULATORY_CARE_PROVIDER_SITE_OTHER): Payer: BC Managed Care – PPO | Admitting: Obstetrics

## 2015-05-20 ENCOUNTER — Encounter: Payer: Self-pay | Admitting: Obstetrics

## 2015-05-20 VITALS — BP 127/86 | HR 84 | Temp 98.3°F | Wt 213.0 lb

## 2015-05-20 DIAGNOSIS — Z01419 Encounter for gynecological examination (general) (routine) without abnormal findings: Secondary | ICD-10-CM | POA: Diagnosis not present

## 2015-05-20 DIAGNOSIS — Z0000 Encounter for general adult medical examination without abnormal findings: Secondary | ICD-10-CM

## 2015-05-20 DIAGNOSIS — Z1389 Encounter for screening for other disorder: Secondary | ICD-10-CM

## 2015-05-20 DIAGNOSIS — Z3169 Encounter for other general counseling and advice on procreation: Secondary | ICD-10-CM

## 2015-05-20 LAB — POCT URINALYSIS DIPSTICK
Bilirubin, UA: NEGATIVE
GLUCOSE UA: NEGATIVE
Ketones, UA: NEGATIVE
LEUKOCYTES UA: NEGATIVE
Nitrite, UA: NEGATIVE
PH UA: 5
Protein, UA: NEGATIVE
RBC UA: NEGATIVE
Spec Grav, UA: 1.02
Urobilinogen, UA: NEGATIVE

## 2015-05-20 MED ORDER — PNV PRENATAL PLUS MULTIVITAMIN 27-1 MG PO TABS: 1.0000 | ORAL_TABLET | Freq: Every day | ORAL | Status: DC

## 2015-05-20 NOTE — Addendum Note (Signed)
Addended by: Luella CookMCINTYRE, Renny Remer E on: 05/20/2015 04:46 PM   Modules accepted: Orders

## 2015-05-20 NOTE — Progress Notes (Signed)
Subjective:        Robin Duncan is a 35 y.o. female here for a routine exam.  Current complaints: none.    Personal health questionnaire:  Is patient Robin Duncan, have a family history of breast and/or ovarian cancer: no Is there a family history of uterine cancer diagnosed at age < 3050, gastrointestinal cancer, urinary tract cancer, family member who is a Personnel officerLynch syndrome-associated Duncan: no Is the patient overweight and hypertensive, family history of diabetes, personal history of gestational diabetes, preeclampsia or PCOS: no Is patient over 5055, have PCOS,  family history of premature CHD under age 37, diabetes, smoke, have hypertension or peripheral artery disease:  no At any time, has a partner hit, kicked or otherwise hurt or frightened you?: no Over the past 2 weeks, have you felt down, depressed or hopeless?: no Over the past 2 weeks, have you felt little interest or pleasure in doing things?:no   Gynecologic History No LMP recorded. Contraception: none Last Pap: 2016. Results were: normal Last mammogram: n/a. Results were: n/a  Obstetric History OB History  No data available    Past Medical History  Diagnosis Date  . Bursitis   . Sickle cell trait (HCC)   . Vaginal Pap smear, abnormal     History reviewed. No pertinent past surgical history.   Current outpatient prescriptions:  .  Prenatal Vit-Fe Fumarate-FA (PNV PRENATAL PLUS MULTIVITAMIN) 27-1 MG TABS, Take 1 tablet by mouth daily before breakfast., Disp: 90 tablet, Rfl: 3 No Known Allergies  Social History  Substance Use Topics  . Smoking status: Current Some Day Smoker  . Smokeless tobacco: Not on file  . Alcohol Use: 0.0 oz/week    0 Standard drinks or equivalent per week    Family History  Problem Relation Age of Onset  . Diabetes Mother   . Hypertension Father       Review of Systems  Constitutional: negative for fatigue and weight loss Respiratory: negative for cough and  wheezing Cardiovascular: negative for chest pain, fatigue and palpitations Gastrointestinal: negative for abdominal pain and change in bowel habits Musculoskeletal:negative for myalgias Neurological: negative for gait problems and tremors Behavioral/Psych: negative for abusive relationship, depression Endocrine: negative for temperature intolerance   Genitourinary:negative for abnormal menstrual periods, genital lesions, hot flashes, sexual problems and vaginal discharge Integument/breast: negative for breast lump, breast tenderness, nipple discharge and skin lesion(s)    Objective:       BP 127/86 mmHg  Pulse 84  Temp(Src) 98.3 F (36.8 C)  Wt 213 lb (96.616 kg) General:   alert  Skin:   no rash or abnormalities  Lungs:   clear to auscultation bilaterally  Heart:   regular rate and rhythm, S1, S2 normal, no murmur, click, rub or gallop  Breasts:   normal without suspicious masses, skin or nipple changes or axillary nodes  Abdomen:  normal findings: no organomegaly, soft, non-tender and no hernia  Pelvis:  External genitalia: normal general appearance Urinary system: urethral meatus normal and bladder without fullness, nontender Vaginal: normal without tenderness, induration or masses Cervix: normal appearance Adnexa: normal bimanual exam Uterus: anteverted and non-tender, normal size   Lab Review Urine pregnancy test Labs reviewed yes Radiologic studies reviewed no    Assessment:    Healthy female exam.   Preconception counseling  Plan:   Prenatal vitamins Rx for Folic Acid supplementation for neuro protection of future pregnancy  Education reviewed: calcium supplements, low fat, low cholesterol diet, safe sex/STD prevention, self  breast exams, smoking cessation and weight bearing exercise. Follow up in: 1 year.   Meds ordered this encounter  Medications  . Prenatal Vit-Fe Fumarate-FA (PNV PRENATAL PLUS MULTIVITAMIN) 27-1 MG TABS    Sig: Take 1 tablet by mouth  daily before breakfast.    Dispense:  90 tablet    Refill:  3   Orders Placed This Encounter  Procedures  . NuSwab Vaginitis Plus (VG+)

## 2015-05-22 LAB — PAP IG AND HPV HIGH-RISK
HPV, high-risk: NEGATIVE
PAP SMEAR COMMENT: 0

## 2015-05-23 LAB — NUSWAB VAGINITIS PLUS (VG+)
CANDIDA ALBICANS, NAA: NEGATIVE
CANDIDA GLABRATA, NAA: NEGATIVE
CHLAMYDIA TRACHOMATIS, NAA: NEGATIVE
NEISSERIA GONORRHOEAE, NAA: NEGATIVE
Trich vag by NAA: NEGATIVE

## 2015-12-03 ENCOUNTER — Encounter (HOSPITAL_COMMUNITY): Payer: Self-pay | Admitting: Emergency Medicine

## 2015-12-03 ENCOUNTER — Ambulatory Visit (HOSPITAL_COMMUNITY)
Admission: EM | Admit: 2015-12-03 | Discharge: 2015-12-03 | Disposition: A | Discharge status: Home / Self Care | Payer: BC Managed Care – PPO | Attending: Family Medicine | Admitting: Family Medicine

## 2015-12-03 DIAGNOSIS — M778 Other enthesopathies, not elsewhere classified: Secondary | ICD-10-CM | POA: Diagnosis not present

## 2015-12-03 NOTE — ED Provider Notes (Signed)
CSN: [id number]     Arrival date & time 12/03/15  1314 History   First MD Initiated Contact with Patient 12/03/15 1351     Chief Complaint  Patient presents with  . 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 Pain is primarily localized to the volar aspect. Her job requires repetitive movement and motion and use of the left wrist hand and digits. Denies any known trauma. No fall or blunt trauma or other injury.      Past Medical History:  Diagnosis Date  . Bursitis   . Sickle cell trait (HCC)   . Vaginal Pap smear, abnormal    History reviewed. No pertinent surgical history. Family History  Problem Relation Age of Onset  . Diabetes Mother   . Hypertension Father    Social History  Substance Use Topics  . Smoking status: Current Some Day Smoker  . Smokeless tobacco: Never Used  . Alcohol use 0.0 oz/week   OB History    No data available     Review of Systems  Constitutional: Negative for activity change, chills and fever.  HENT: Negative.   Respiratory: Negative.   Cardiovascular: Negative.   Musculoskeletal:       As per HPI  Skin: Negative for color change, pallor and rash.  Neurological: Negative.   All other systems reviewed and are negative.   Allergies  Patient has no known allergies.  Home Medications   Prior to Admission medications   Medication Sig Start Date End Date Taking? Authorizing Provider  Prenatal Vit-Fe Fumarate-FA (PNV PRENATAL PLUS MULTIVITAMIN) 27-1 MG TABS Take 1 tablet by mouth daily before breakfast. 05/20/15  Yes Brock Badharles A Harper, MD   Meds Ordered and Administered this Visit  Medications - No data to display  BP 125/81 (BP Location: Right Arm)   Pulse 81   Temp 98 F (36.7 C) (Oral)   Resp 18   LMP 12/01/2015 (Exact Date)   SpO2 98%  No data found.   Physical Exam  Constitutional:  She is oriented to person, place, and time. She appears well-developed and well-nourished. No distress.  HENT:  Head: Normocephalic and atraumatic.  Eyes: EOM are normal. Pupils are equal, round, and reactive to light.  Neck: Normal range of motion. Neck supple.  Musculoskeletal:  Minor swelling to the volar aspect of the left wrist. Positive tenderness and Tinel sign. Flexion is weak and painful and limited. Extension also produces tenderness to the volar aspect. Range of motion limited. There is no deformity or discoloration. Movement of the digits are intact. No tenderness to the hand or digits. Distal neurovascular motor sensory is grossly intact.  Lymphadenopathy:    She has no cervical adenopathy.  Neurological: She is alert and oriented to person, place, and time. No cranial nerve deficit.  Skin: Skin is warm and dry.  Psychiatric: She has a normal mood and affect.  Nursing note and vitals reviewed.   Urgent Care Course   Clinical Course     Procedures (including critical care time)  Labs Review Labs Reviewed - No data to display  Imaging Review No results found.   Visual Acuity Review  Right Eye Distance:   Left Eye Distance:   Bilateral Distance:    Right Eye Near:   Left Eye Near:    Bilateral Near:         MDM   1. Left wrist tendinitis  Ice to sore,inflammed area 3 times a day Wear the wrist splint daily at work and when working at home.  Aleve or Motrin as needed Wrist splint    Hayden Rasmussenavid Sophiamarie Nease, NP 12/03/15 1431

## 2015-12-03 NOTE — Discharge Instructions (Signed)
Ice to sore,inflammed area 3 times a day Wear the wrist splint daily at work and when working at home.  Aleve or Motrin as needed

## 2015-12-03 NOTE — ED Triage Notes (Signed)
[facility] a [facility] and swelling to her left wrist. The patient stated that she noticed the pain and swelling about 3 days ago and denied any known injury.

## 2015-12-17 ENCOUNTER — Telehealth: Payer: Self-pay | Admitting: *Deleted

## 2015-12-17 NOTE — Telephone Encounter (Signed)
Please review for RF 

## 2015-12-17 NOTE — Telephone Encounter (Signed)
PATIENT HAS REFILLS OF FLUCONAZOLE BUT THE SCRIPT HAS EXPIRED... PLEASE ADVISE.Marland Kitchen..Marland Kitchen

## 2015-12-18 ENCOUNTER — Other Ambulatory Visit: Payer: Self-pay | Admitting: Obstetrics

## 2015-12-18 DIAGNOSIS — B3731 Acute candidiasis of vulva and vagina: Secondary | ICD-10-CM

## 2015-12-18 DIAGNOSIS — B373 Candidiasis of vulva and vagina: Secondary | ICD-10-CM

## 2015-12-18 MED ORDER — FLUCONAZOLE 150 MG PO TABS: 150.0000 mg | ORAL_TABLET | Freq: Once | ORAL | 2 refills | Status: AC

## 2015-12-18 NOTE — Telephone Encounter (Signed)
Fluconazole Rx

## 2016-05-21 ENCOUNTER — Ambulatory Visit (INDEPENDENT_AMBULATORY_CARE_PROVIDER_SITE_OTHER): Payer: BC Managed Care – PPO | Admitting: Obstetrics

## 2016-05-21 ENCOUNTER — Encounter: Payer: Self-pay | Admitting: Obstetrics

## 2016-05-21 VITALS — BP 144/91 | HR 102 | Wt 208.0 lb

## 2016-05-21 DIAGNOSIS — Z124 Encounter for screening for malignant neoplasm of cervix: Secondary | ICD-10-CM

## 2016-05-21 DIAGNOSIS — Z113 Encounter for screening for infections with a predominantly sexual mode of transmission: Secondary | ICD-10-CM

## 2016-05-21 DIAGNOSIS — Z1151 Encounter for screening for human papillomavirus (HPV): Secondary | ICD-10-CM

## 2016-05-21 DIAGNOSIS — Z3046 Encounter for surveillance of implantable subdermal contraceptive: Secondary | ICD-10-CM

## 2016-05-21 DIAGNOSIS — Z01419 Encounter for gynecological examination (general) (routine) without abnormal findings: Secondary | ICD-10-CM

## 2016-05-21 NOTE — Progress Notes (Signed)
Subjective:        Robin Duncan is a 36 y.o. female here for a routine exam.  Current complaints: None.    Personal health questionnaire:  Is patient Ashkenazi Jewish, have a family history of breast and/or ovarian cancer: no Is there a family history of uterine cancer diagnosed at age < 75, gastrointestinal cancer, urinary tract cancer, family member who is a Personnel officer syndrome-associated carrier: no Is the patient overweight and hypertensive, family history of diabetes, personal history of gestational diabetes, preeclampsia or PCOS: no Is patient over 22, have PCOS,  family history of premature CHD under age 9, diabetes, smoke, have hypertension or peripheral artery disease:  no At any time, has a partner hit, kicked or otherwise hurt or frightened you?: no Over the past 2 weeks, have you felt down, depressed or hopeless?: no Over the past 2 weeks, have you felt little interest or pleasure in doing things?:no   Gynecologic History Patient's last menstrual period was 05/14/2016. Contraception: Nexplanon Last Pap: 2017. Results were: normal Last mammogram: n/a. Results were: n/a  Obstetric History OB History  No data available    Past Medical History:  Diagnosis Date  . Bursitis   . Sickle cell trait (HCC)   . Vaginal Pap smear, abnormal     History reviewed. No pertinent surgical history.   Current Outpatient Prescriptions:  .  Prenatal Vit-Fe Fumarate-FA (PNV PRENATAL PLUS MULTIVITAMIN) 27-1 MG TABS, Take 1 tablet by mouth daily before breakfast., Disp: 90 tablet, Rfl: 3 No Known Allergies  Social History  Substance Use Topics  . Smoking status: Current Some Day Smoker  . Smokeless tobacco: Never Used  . Alcohol use 0.0 oz/week    Family History  Problem Relation Age of Onset  . Diabetes Mother   . Hypertension Father       Review of Systems  Constitutional: negative for fatigue and weight loss Respiratory: negative for cough and  wheezing Cardiovascular: negative for chest pain, fatigue and palpitations Gastrointestinal: negative for abdominal pain and change in bowel habits Musculoskeletal:negative for myalgias Neurological: negative for gait problems and tremors Behavioral/Psych: negative for abusive relationship, depression Endocrine: negative for temperature intolerance    Genitourinary:negative for abnormal menstrual periods, genital lesions, hot flashes, sexual problems and vaginal discharge Integument/breast: negative for breast lump, breast tenderness, nipple discharge and skin lesion(s)    Objective:       BP (!) 144/91   Pulse (!) 102   Wt 208 lb (94.3 kg)   LMP 05/14/2016   BMI 35.70 kg/m  General:   alert  Skin:   no rash or abnormalities  Lungs:   clear to auscultation bilaterally  Heart:   regular rate and rhythm, S1, S2 normal, no murmur, click, rub or gallop  Breasts:   normal without suspicious masses, skin or nipple changes or axillary nodes  Abdomen:  normal findings: no organomegaly, soft, non-tender and no hernia  Pelvis:  External genitalia: normal general appearance Urinary system: urethral meatus normal and bladder without fullness, nontender Vaginal: normal without tenderness, induration or masses Cervix: normal appearance Adnexa: normal bimanual exam Uterus: anteverted and non-tender, normal size   Lab Review Urine pregnancy test Labs reviewed yes Radiologic studies reviewed no  50% of 20 min visit spent on counseling and coordination of care.    Assessment:    Healthy female exam.    Contraceptive Counseling and Advice.  Wants Nexplanon   Plan:    Education reviewed: calcium supplements, depression evaluation, low  fat, low cholesterol diet, safe sex/STD prevention, self breast exams, smoking cessation and weight bearing exercise. Contraception: Nexplanon. Follow up in: 3 weeks.  Nexplanon Insertion  No orders of the defined types were placed in this  encounter.  No orders of the defined types were placed in this encounter.    Patient ID: Robin Duncan, female   DOB: [birth year], 36 y.o.   MRN: [id number]

## 2016-05-22 LAB — CERVICOVAGINAL ANCILLARY ONLY
Bacterial vaginitis: POSITIVE — AB
CANDIDA VAGINITIS: NEGATIVE
CHLAMYDIA, DNA PROBE: NEGATIVE
NEISSERIA GONORRHEA: NEGATIVE
TRICH (WINDOWPATH): NEGATIVE

## 2016-05-22 LAB — CYTOLOGY - PAP
DIAGNOSIS: NEGATIVE
HPV: NOT DETECTED

## 2016-05-23 ENCOUNTER — Other Ambulatory Visit: Payer: Self-pay | Admitting: Obstetrics

## 2016-05-23 DIAGNOSIS — B9689 Other specified bacterial agents as the cause of diseases classified elsewhere: Secondary | ICD-10-CM

## 2016-05-23 DIAGNOSIS — N760 Acute vaginitis: Principal | ICD-10-CM

## 2016-05-23 MED ORDER — METRONIDAZOLE 500 MG PO TABS: 500.0000 mg | ORAL_TABLET | Freq: Two times a day (BID) | ORAL | 2 refills | Status: DC

## 2016-10-05 ENCOUNTER — Other Ambulatory Visit: Payer: Self-pay | Admitting: *Deleted

## 2016-10-05 DIAGNOSIS — B379 Candidiasis, unspecified: Secondary | ICD-10-CM

## 2016-10-05 MED ORDER — FLUCONAZOLE 150 MG PO TABS: 150.0000 mg | ORAL_TABLET | Freq: Once | ORAL | 0 refills | Status: AC

## 2016-10-05 NOTE — Progress Notes (Signed)
Pt called to office with symptoms of yeast infection, would like pill tx.  Diflucan sent to pharmacy per protocol.

## 2017-01-26 NOTE — L&D Delivery Note (Signed)
OB/GYN Faculty Practice Delivery Note  Robin Duncan is a 37 y.o. [id number] s/p SVD at 484w4d. She was admitted for spontaneous onset of labor.   ROM: rupture date, rupture time, delivery date, or delivery time have not been documented with clear fluid GBS Status: negative Maximum Maternal Temperature: Temp (24hrs), Avg:97.8 F (36.6 C), Min:97.5 F (36.4 C), Max:98 F (36.7 C)  Labor Progress: Admitted in active labor. Was dilated 8cm on arrival to floor, received epidural.  Delivery: Called to room and patient was complete and pushing. Head delivered LOA. No nuchal cord present. Shoulder and body delivered in usual fashion. Infant with spontaneous cry, placed on mother's abdomen, dried and stimulated. Cord clamped x 2 after 1-minute delay, and cut by father of baby. Cord blood drawn. Placenta delivered spontaneously with gentle cord traction. Fundus firm with massage and Pitocin. Labia, perineum, vagina, and cervix inspected inspected with no lacerations.   Placenta: spontaneous, intact, 3-vessel cord Complications: none Lacerations: none EBL: 50cc  Postpartum Planning [x]  message to sent to schedule follow-up  [x]  vaccines UTD  Infant: Vigorous female  APGAR 7, 9  skin-to-skin with mother   Robin DeerLaurel S. Earlene PlaterWallace, DO OB/GYN Fellow, Faculty Practice

## 2017-01-28 ENCOUNTER — Ambulatory Visit (INDEPENDENT_AMBULATORY_CARE_PROVIDER_SITE_OTHER): Payer: BC Managed Care – PPO | Admitting: Pediatrics

## 2017-01-28 DIAGNOSIS — Z3201 Encounter for pregnancy test, result positive: Secondary | ICD-10-CM | POA: Diagnosis not present

## 2017-01-28 DIAGNOSIS — N912 Amenorrhea, unspecified: Secondary | ICD-10-CM | POA: Diagnosis not present

## 2017-01-28 LAB — POCT URINE PREGNANCY: Preg Test, Ur: POSITIVE — AB

## 2017-01-28 NOTE — Progress Notes (Signed)
I have reviewed this chart and agree with the RN/CMA assessment and management.    K. Meryl Davis, M.D. Attending Obstetrician & Gynecologist, Faculty Practice [facility], [facility]  

## 2017-01-28 NOTE — Progress Notes (Signed)
Patient in office today d/t amenorrhea.  She reports unsure of LMP,  sometime around 11/20/16.  She was given samples of PNV today at office and advised to contact [employer] to see what they pay for.  She was advised to sch NOB before leaving office today.

## 2017-02-15 ENCOUNTER — Ambulatory Visit (INDEPENDENT_AMBULATORY_CARE_PROVIDER_SITE_OTHER): Payer: BC Managed Care – PPO | Admitting: Certified Nurse Midwife

## 2017-02-15 ENCOUNTER — Encounter: Payer: Self-pay | Admitting: Certified Nurse Midwife

## 2017-02-15 ENCOUNTER — Ambulatory Visit: Payer: BC Managed Care – PPO

## 2017-02-15 ENCOUNTER — Other Ambulatory Visit: Payer: Self-pay

## 2017-02-15 ENCOUNTER — Other Ambulatory Visit (HOSPITAL_COMMUNITY)
Admission: RE | Admit: 2017-02-15 | Discharge: 2017-02-15 | Disposition: A | Discharge status: Home / Self Care | Payer: BC Managed Care – PPO | Source: Ambulatory Visit | Attending: Certified Nurse Midwife | Admitting: Certified Nurse Midwife

## 2017-02-15 VITALS — BP 121/78 | HR 91 | Temp 98.1°F | Wt 222.8 lb

## 2017-02-15 DIAGNOSIS — Z3491 Encounter for supervision of normal pregnancy, unspecified, first trimester: Secondary | ICD-10-CM | POA: Diagnosis not present

## 2017-02-15 DIAGNOSIS — O3680X0 Pregnancy with inconclusive fetal viability, not applicable or unspecified: Secondary | ICD-10-CM | POA: Diagnosis not present

## 2017-02-15 DIAGNOSIS — O09522 Supervision of elderly multigravida, second trimester: Secondary | ICD-10-CM | POA: Insufficient documentation

## 2017-02-15 DIAGNOSIS — Z3490 Encounter for supervision of normal pregnancy, unspecified, unspecified trimester: Secondary | ICD-10-CM | POA: Diagnosis present

## 2017-02-15 DIAGNOSIS — O09521 Supervision of elderly multigravida, first trimester: Secondary | ICD-10-CM

## 2017-02-15 DIAGNOSIS — Z8759 Personal history of other complications of pregnancy, childbirth and the puerperium: Secondary | ICD-10-CM

## 2017-02-15 MED ORDER — ASPIRIN 81 MG PO CHEW: 81.0000 mg | CHEWABLE_TABLET | Freq: Every day | ORAL | 12 refills | Status: DC

## 2017-02-15 MED ORDER — OB COMPLETE PETITE 35-5-1-200 MG PO CAPS: 1.0000 | ORAL_CAPSULE | Freq: Every day | ORAL | 12 refills | Status: DC

## 2017-02-15 NOTE — Progress Notes (Signed)
Complains of chest pains.

## 2017-02-15 NOTE — Progress Notes (Signed)
Subjective:   Robin Duncan is a 37 y.o. [id number] at 2323w3d by LMP in office scan for absent heart tones: CRL 10257w4d being seen today for her first obstetrical visit.  Her obstetrical history is significant for advanced maternal age and obesity. Patient does intend to breast feed. Pregnancy history fully reviewed.  Patient reports backache, nausea, no bleeding, no contractions, no cramping and no leaking.  HISTORY: Obstetric History   G4   P2   T2   P0   A1   L2    SAB0   TAB0   Ectopic0   Multiple0   Live Births0     # Outcome Date GA Lbr Len/2nd Weight Sex Delivery Anes PTL Lv  4 Current           3 AB           2 Term           1 Term              Past Medical History:  Diagnosis Date  . Bursitis   . Pregnancy induced hypertension   . Sickle cell trait (HCC)   . Vaginal Pap smear, abnormal    No past surgical history on file. Family History  Problem Relation Age of Onset  . Diabetes Mother   . Hypertension Father    Social History   Tobacco Use  . Smoking status: Current Some Day Smoker  . Smokeless tobacco: Never Used  Substance Use Topics  . Alcohol use: Yes    Alcohol/week: 0.0 oz  . Drug use: No   No Known Allergies Current Outpatient Medications on File Prior to Visit  Medication Sig Dispense Refill  . metroNIDAZOLE (FLAGYL) 500 MG tablet Take 1 tablet (500 mg total) by mouth 2 (two) times daily. (Patient not taking: Reported on 02/15/2017) 14 tablet 2  . Prenatal Vit-Fe Fumarate-FA (PNV PRENATAL PLUS MULTIVITAMIN) 27-1 MG TABS Take 1 tablet by mouth daily before breakfast. 90 tablet 3   No current facility-administered medications on file prior to visit.      Exam   Vitals:   02/15/17 1434  BP: 121/78  Pulse: 91  Temp: 98.1 F (36.7 C)  Weight: 222 lb 12.8 oz (101.1 kg)      Uterus:     Pelvic Exam: Perineum: no hemorrhoids, normal perineum   Vulva: normal external genitalia, no lesions   Vagina:  normal mucosa, normal discharge   Cervix: no lesions and normal, pap smear done.    Adnexa: normal adnexa and no mass, fullness, tenderness   Bony Pelvis: average  System: General: well-developed, well-nourished female in no acute distress   Breast:  normal appearance, no masses or tenderness   Skin: normal coloration and turgor, no rashes   Neurologic: oriented, normal, negative, normal mood   Extremities: normal strength, tone, and muscle mass, ROM of all joints is normal   HEENT PERRLA, extraocular movement intact and sclera clear, anicteric   Mouth/Teeth mucous membranes moist, pharynx normal without lesions and dental hygiene good   Neck supple and no masses   Cardiovascular: regular rate and rhythm   Respiratory:  no respiratory distress, normal breath sounds   Abdomen: soft, non-tender; bowel sounds normal; no masses,  no organomegaly     Assessment:   Pregnancy: [id number] Patient Active Problem List   Diagnosis Date Noted  . Supervision of normal pregnancy, antepartum 02/15/2017  . Advanced maternal age in multigravida 02/15/2017  . History of  pregnancy induced hypertension 02/15/2017     Plan:  1. Encounter for supervision of normal pregnancy, antepartum, unspecified gravidity      - Obstetric Panel, Including HIV - Hemoglobinopathy evaluation - Cystic Fibrosis Mutation 97 - Culture, OB Urine - Cervicovaginal ancillary only - Hemoglobin A1c - [place] MFM OB DETAIL +14 WK; Future - Prenat-FeCbn-FeAspGl-FA-Omega (OB COMPLETE PETITE) 35-5-1-200 MG CAPS; Take 1 tablet by mouth daily.  Dispense: 30 capsule; Refill: 12 - Vitamin D (25 hydroxy) - US OB Transvaginal; Future - US OB Comp Less 14 Wks; Future  2. Elderly multigravida in first trimester     3. History of pregnancy induced hypertension    - aspirin 81 MG chewable tablet; Chew 1 tablet (81 mg total) by mouth daily.  Dispense: 30 tablet; Refill: 12  4. Encounter to determine fetal viability of pregnancy, single or unspecified fetus    - US OB  Limited   Initial labs drawn. Continue prenatal vitamins. Genetic Screening discussed, NIPS: requested. Ultrasound discussed; fetal anatomic survey: requested. Problem list reviewed and updated. The nature of [facility] with multiple MDs and other Advanced Practice Providers was explained to patient; also emphasized that residents, students are part of our team. Routine obstetric precautions reviewed. Return in about 4 weeks (around 03/15/2017) for ROB.     Orvilla Cornwall, CNM [facility] [employer], [facility]

## 2017-02-16 ENCOUNTER — Encounter (HOSPITAL_COMMUNITY): Payer: Self-pay | Admitting: Family Medicine

## 2017-02-16 ENCOUNTER — Ambulatory Visit (HOSPITAL_COMMUNITY)
Admission: EM | Admit: 2017-02-16 | Discharge: 2017-02-16 | Disposition: A | Discharge status: Home / Self Care | Payer: BC Managed Care – PPO | Attending: Family Medicine | Admitting: Family Medicine

## 2017-02-16 ENCOUNTER — Telehealth: Payer: Self-pay

## 2017-02-16 DIAGNOSIS — R05 Cough: Secondary | ICD-10-CM

## 2017-02-16 DIAGNOSIS — J069 Acute upper respiratory infection, unspecified: Secondary | ICD-10-CM | POA: Diagnosis not present

## 2017-02-16 LAB — CERVICOVAGINAL ANCILLARY ONLY
BACTERIAL VAGINITIS: POSITIVE — AB
CANDIDA VAGINITIS: POSITIVE — AB
CHLAMYDIA, DNA PROBE: NEGATIVE
Neisseria Gonorrhea: NEGATIVE
Trichomonas: NEGATIVE

## 2017-02-16 MED ORDER — AMOXICILLIN 875 MG PO TABS: 875.0000 mg | ORAL_TABLET | Freq: Two times a day (BID) | ORAL | 0 refills | Status: DC

## 2017-02-16 NOTE — Telephone Encounter (Signed)
TC from pt asking about her gestational age since she's unsure about LMP. Aprx. gest age given.  Pt aware upcoming u/s will confirm.

## 2017-02-16 NOTE — ED Triage Notes (Signed)
PT C/O: cold sx onset 2 days associated w/Sore throat, CP, weakness, nasal congestion/drainage, dry cough,    Pt is [redacted] weeks pregnant.   DENIES: fevers  TAKING MEDS: none   A&O x4... NAD... Ambulatory

## 2017-02-16 NOTE — ED Provider Notes (Signed)
[facility]   [id number] 02/16/17 Arrival Time: 1025   SUBJECTIVE:  Robin Duncan is a 37 y.o. female who presents to the urgent care with complaint of upper respiratory infection symptoms.  Complains of sore throat, weakness, nasal congestion and dry cough x 2 days.    No fever.   Past Medical History:  Diagnosis Date  . Bursitis   . Pregnancy induced hypertension   . Sickle cell trait (HCC)   . Vaginal Pap smear, abnormal    Family History  Problem Relation Age of Onset  . Diabetes Mother   . Hypertension Father    Social History   Socioeconomic History  . Marital status: Single    Spouse name: Not on file  . Number of children: Not on file  . Years of education: Not on file  . Highest education level: Not on file  Social Needs  . Financial resource strain: Not on file  . Food insecurity - worry: Not on file  . Food insecurity - inability: Not on file  . Transportation needs - medical: Not on file  . Transportation needs - non-medical: Not on file  Occupational History  . Not on file  Tobacco Use  . Smoking status: Current Some Day Smoker  . Smokeless tobacco: Never Used  Substance and Sexual Activity  . Alcohol use: Yes    Alcohol/week: 0.0 oz  . Drug use: No  . Sexual activity: Yes    Partners: Male    Birth control/protection: Implant  Other Topics Concern  . Not on file  Social History Narrative  . Not on file   Current Meds  Medication Sig  . aspirin 81 MG chewable tablet Chew 1 tablet (81 mg total) by mouth daily.  . Prenatal Vit-Fe Fumarate-FA (PNV PRENATAL PLUS MULTIVITAMIN) 27-1 MG TABS Take 1 tablet by mouth daily before breakfast.   No Known Allergies    ROS: As per HPI, remainder of ROS negative.   OBJECTIVE:   Vitals:   02/16/17 1047  BP: 134/77  Pulse: 85  Resp: 20  Temp: 97.9 F (36.6 C)  TempSrc: Oral  SpO2: 98%     General appearance: alert; no distress Eyes: PERRL; EOMI; conjunctiva normal HENT:  normocephalic; atraumatic; TMs normal, canal normal, external ears normal without trauma; nasal mucosa thick nasal discharge; oral mucosa normal Neck: supple Lungs: clear to auscultation bilaterally Heart: regular rate and rhythm Abdomen: soft, non-tender; gravid Back: no CVA tenderness Extremities: no cyanosis or edema; symmetrical with no gross deformities Skin: warm and dry Neurologic: normal gait; grossly normal Psychological: alert and cooperative; normal mood and affect      Labs:  Results for orders placed or performed in visit on 01/28/17  POCT urine pregnancy  Result Value Ref Range   Preg Test, Ur Positive (A) Negative    Labs Reviewed - No data to display  No results found.     ASSESSMENT & PLAN:  1. Acute upper respiratory infection     Meds ordered this encounter  Medications  . amoxicillin (AMOXIL) 875 MG tablet    Sig: Take 1 tablet (875 mg total) by mouth 2 (two) times daily.    Dispense:  20 tablet    Refill:  0    Reviewed expectations re: course of current medical issues. Questions answered. Outlined signs and symptoms indicating need for more acute intervention. Patient verbalized understanding. After Visit Summary given.    Procedures:      Elvina SidleLauenstein, Demiah Gullickson, MD 02/16/17  1100  

## 2017-02-16 NOTE — Discharge Instructions (Signed)
Robitussin DM for the cough

## 2017-02-17 ENCOUNTER — Other Ambulatory Visit: Payer: Self-pay | Admitting: Certified Nurse Midwife

## 2017-02-17 DIAGNOSIS — B373 Candidiasis of vulva and vagina: Secondary | ICD-10-CM

## 2017-02-17 DIAGNOSIS — N760 Acute vaginitis: Principal | ICD-10-CM

## 2017-02-17 DIAGNOSIS — B9689 Other specified bacterial agents as the cause of diseases classified elsewhere: Secondary | ICD-10-CM

## 2017-02-17 DIAGNOSIS — B3731 Acute candidiasis of vulva and vagina: Secondary | ICD-10-CM

## 2017-02-17 LAB — URINE CULTURE, OB REFLEX

## 2017-02-17 LAB — CULTURE, OB URINE

## 2017-02-17 MED ORDER — METRONIDAZOLE 0.75 % VA GEL: 1.0000 | Freq: Two times a day (BID) | VAGINAL | 0 refills | Status: DC

## 2017-02-17 MED ORDER — TERCONAZOLE 0.8 % VA CREA: 1.0000 | TOPICAL_CREAM | Freq: Every day | VAGINAL | 0 refills | Status: DC

## 2017-02-18 ENCOUNTER — Other Ambulatory Visit: Payer: Self-pay | Admitting: Certified Nurse Midwife

## 2017-02-18 DIAGNOSIS — E559 Vitamin D deficiency, unspecified: Secondary | ICD-10-CM | POA: Insufficient documentation

## 2017-02-18 DIAGNOSIS — Z3480 Encounter for supervision of other normal pregnancy, unspecified trimester: Secondary | ICD-10-CM

## 2017-02-18 DIAGNOSIS — D573 Sickle-cell trait: Secondary | ICD-10-CM | POA: Insufficient documentation

## 2017-02-18 LAB — OBSTETRIC PANEL, INCLUDING HIV
Antibody Screen: NEGATIVE
BASOS ABS: 0 10*3/uL (ref 0.0–0.2)
Basos: 0 %
EOS (ABSOLUTE): 0.1 10*3/uL (ref 0.0–0.4)
Eos: 2 %
HEP B S AG: NEGATIVE
HIV Screen 4th Generation wRfx: NONREACTIVE
Hematocrit: 35.8 % (ref 34.0–46.6)
Hemoglobin: 12.4 g/dL (ref 11.1–15.9)
IMMATURE GRANULOCYTES: 1 %
Immature Grans (Abs): 0 10*3/uL (ref 0.0–0.1)
Lymphocytes Absolute: 1.2 10*3/uL (ref 0.7–3.1)
Lymphs: 21 %
MCH: 27.6 pg (ref 26.6–33.0)
MCHC: 34.6 g/dL (ref 31.5–35.7)
MCV: 80 fL (ref 79–97)
MONOCYTES: 14 %
Monocytes Absolute: 0.8 10*3/uL (ref 0.1–0.9)
NEUTROS PCT: 62 %
Neutrophils Absolute: 3.6 10*3/uL (ref 1.4–7.0)
PLATELETS: 268 10*3/uL (ref 150–379)
RBC: 4.49 x10E6/uL (ref 3.77–5.28)
RDW: 13.9 % (ref 12.3–15.4)
RPR: NONREACTIVE
RUBELLA: 1.35 {index} (ref >0.99)
Rh Factor: POSITIVE
WBC: 5.7 10*3/uL (ref 3.4–10.8)

## 2017-02-18 LAB — HEMOGLOBINOPATHY EVALUATION
HGB A: 57 % — AB (ref 96.4–98.8)
HGB C: 0 %
HGB S: 38.4 % — AB
HGB VARIANT: 0 %
Hemoglobin A2 Quantitation: 3.8 % — ABNORMAL HIGH (ref 1.8–3.2)
Hemoglobin F Quantitation: 0.8 % (ref 0.0–2.0)

## 2017-02-18 LAB — VITAMIN D 25 HYDROXY (VIT D DEFICIENCY, FRACTURES): VIT D 25 HYDROXY: 18.1 ng/mL — AB (ref 30.0–100.0)

## 2017-02-18 LAB — HEMOGLOBIN A1C
Est. average glucose Bld gHb Est-mCnc: 105 mg/dL
Hgb A1c MFr Bld: 5.3 % (ref 4.8–5.6)

## 2017-02-18 MED ORDER — VITAMIN D (ERGOCALCIFEROL) 1.25 MG (50000 UNIT) PO CAPS: 50000.0000 [IU] | ORAL_CAPSULE | ORAL | 2 refills | Status: DC

## 2017-02-23 LAB — CYSTIC FIBROSIS MUTATION 97: GENE DIS ANAL CARRIER INTERP BLD/T-IMP: NOT DETECTED

## 2017-02-24 ENCOUNTER — Other Ambulatory Visit: Payer: Self-pay | Admitting: Certified Nurse Midwife

## 2017-02-24 DIAGNOSIS — Z3480 Encounter for supervision of other normal pregnancy, unspecified trimester: Secondary | ICD-10-CM

## 2017-02-25 ENCOUNTER — Ambulatory Visit (HOSPITAL_COMMUNITY)
Admission: RE | Admit: 2017-02-25 | Discharge: 2017-02-25 | Disposition: A | Discharge status: Home / Self Care | Payer: BC Managed Care – PPO | Source: Ambulatory Visit | Attending: Certified Nurse Midwife | Admitting: Certified Nurse Midwife

## 2017-02-25 ENCOUNTER — Other Ambulatory Visit: Payer: Self-pay | Admitting: Certified Nurse Midwife

## 2017-02-25 DIAGNOSIS — Z3A09 9 weeks gestation of pregnancy: Secondary | ICD-10-CM | POA: Insufficient documentation

## 2017-02-25 DIAGNOSIS — Z3491 Encounter for supervision of normal pregnancy, unspecified, first trimester: Secondary | ICD-10-CM | POA: Insufficient documentation

## 2017-02-25 DIAGNOSIS — Z3490 Encounter for supervision of normal pregnancy, unspecified, unspecified trimester: Secondary | ICD-10-CM

## 2017-03-15 ENCOUNTER — Ambulatory Visit (INDEPENDENT_AMBULATORY_CARE_PROVIDER_SITE_OTHER): Payer: BC Managed Care – PPO | Admitting: Certified Nurse Midwife

## 2017-03-15 ENCOUNTER — Encounter: Payer: Self-pay | Admitting: Certified Nurse Midwife

## 2017-03-15 VITALS — BP 137/88 | Wt 230.0 lb

## 2017-03-15 DIAGNOSIS — O09522 Supervision of elderly multigravida, second trimester: Secondary | ICD-10-CM

## 2017-03-15 DIAGNOSIS — Z3480 Encounter for supervision of other normal pregnancy, unspecified trimester: Secondary | ICD-10-CM

## 2017-03-15 NOTE — Progress Notes (Signed)
   PRENATAL VISIT NOTE  Subjective:  Robin Duncan is a 37 y.o. [id number] at 4448w2d being seen today for ongoing prenatal care.  She is currently monitored for the following issues for this low-risk pregnancy and has Supervision of normal pregnancy, antepartum; Advanced maternal age in multigravida; History of pregnancy induced hypertension; Sickle cell trait (HCC); and Vitamin D deficiency on their problem list.  Patient reports no complaints.  Contractions: Not present. Vag. Bleeding: None.   . Denies leaking of fluid.   The following portions of the patient's history were reviewed and updated as appropriate: allergies, current medications, past family history, past medical history, past social history, past surgical history and problem list. Problem list updated.  Objective:   Vitals:   03/15/17 1414 03/15/17 1416  BP: (!) 146/94 137/88  Weight: 230 lb (104.3 kg)     Fetal Status:           General:  Alert, oriented and cooperative. Patient is in no acute distress.  Skin: Skin is warm and dry. No rash noted.   Cardiovascular: Normal heart rate noted  Respiratory: Normal respiratory effort, no problems with respiration noted  Abdomen: Soft, gravid, appropriate for gestational age.  Pain/Pressure: Present     Pelvic: Cervical exam deferred        Extremities: Normal range of motion.  Edema: None  Mental Status:  Normal mood and affect. Normal behavior. Normal judgment and thought content.   Assessment and Plan:  Pregnancy: [id number] at 1848w2d  1. Supervision of other normal pregnancy, antepartum      Doing well. Elevated blood pressure today.  Normotensive previously will watch for CHTN.  - Genetic Screening  2. Elderly multigravida in second trimester     <40 years.   Preterm labor symptoms and general obstetric precautions including but not limited to vaginal bleeding, contractions, leaking of fluid and fetal movement were reviewed in detail with the patient. Please refer to  After Visit Summary for other counseling recommendations.  Return in about 4 weeks (around 04/12/2017) for ROB.   Roe Coombsachelle A Denney, CNM

## 2017-04-02 ENCOUNTER — Ambulatory Visit (HOSPITAL_COMMUNITY): Payer: BC Managed Care – PPO

## 2017-04-06 ENCOUNTER — Other Ambulatory Visit: Payer: Self-pay | Admitting: Certified Nurse Midwife

## 2017-04-06 DIAGNOSIS — Z3480 Encounter for supervision of other normal pregnancy, unspecified trimester: Secondary | ICD-10-CM

## 2017-04-12 ENCOUNTER — Ambulatory Visit (INDEPENDENT_AMBULATORY_CARE_PROVIDER_SITE_OTHER): Payer: BC Managed Care – PPO | Admitting: Certified Nurse Midwife

## 2017-04-12 VITALS — BP 128/83 | HR 102 | Wt 232.2 lb

## 2017-04-12 DIAGNOSIS — Z3480 Encounter for supervision of other normal pregnancy, unspecified trimester: Secondary | ICD-10-CM

## 2017-04-12 DIAGNOSIS — E559 Vitamin D deficiency, unspecified: Secondary | ICD-10-CM

## 2017-04-12 DIAGNOSIS — Z8759 Personal history of other complications of pregnancy, childbirth and the puerperium: Secondary | ICD-10-CM

## 2017-04-12 DIAGNOSIS — O09522 Supervision of elderly multigravida, second trimester: Secondary | ICD-10-CM

## 2017-04-12 NOTE — Progress Notes (Signed)
   PRENATAL VISIT NOTE  Subjective:  Robin Duncan is a 37 y.o. [id number] at 24w2dbeing seen today for ongoing prenatal care.  She is currently monitored for the following issues for this low-risk pregnancy and has Supervision of normal pregnancy, antepartum; Advanced maternal age in multigravida; History of pregnancy induced hypertension; Sickle cell trait (HOwings; and Vitamin D deficiency on their problem list.  Patient reports no bleeding, no contractions, no cramping, no leaking and occasional round ligament type pain, No LOF/Vaginal bleeding.  Contractions: Irritability. Vag. Bleeding: None.  Movement: Present. Denies leaking of fluid.   The following portions of the patient's history were reviewed and updated as appropriate: allergies, current medications, past family history, past medical history, past social history, past surgical history and problem list. Problem list updated.  Objective:   Vitals:   04/12/17 1428  BP: 128/83  Pulse: (!) 102  Weight: 232 lb 3.2 oz (105.3 kg)    Fetal Status: Fetal Heart Rate (bpm): 144; doppler   Movement: Present     General:  Alert, oriented and cooperative. Patient is in no acute distress.  Skin: Skin is warm and dry. No rash noted.   Cardiovascular: Normal heart rate noted  Respiratory: Normal respiratory effort, no problems with respiration noted  Abdomen: Soft, gravid, appropriate for gestational age.  Pain/Pressure: Absent     Pelvic: Cervical exam deferred        Extremities: Normal range of motion.  Edema: Trace  Mental Status:  Normal mood and affect. Normal behavior. Normal judgment and thought content.   Assessment and Plan:  Pregnancy: [id number] 19w2d1. Supervision of other normal pregnancy, antepartum     Doing well.  SW met with patient in office - AFP, Serum, Open Spina Bifida  2. Vitamin D deficiency     Taking weekly vitamin D  3. History of pregnancy induced hypertension     Taking baby ASA  4. Elderly  multigravida in second trimester     <40 years  Preterm labor symptoms and general obstetric precautions including but not limited to vaginal bleeding, contractions, leaking of fluid and fetal movement were reviewed in detail with the patient. Please refer to After Visit Summary for other counseling recommendations.  Return in about 4 weeks (around 05/10/2017) for ROB.   RaMorene CrockerCNM

## 2017-04-16 LAB — AFP, SERUM, OPEN SPINA BIFIDA
AFP MOM: 1.1
AFP VALUE AFPOSL: 31.1 ng/mL
Gest. Age on Collection Date: 16.3 weeks
MATERNAL AGE AT EDD: 38.8 a
OSBR RISK 1 IN: 10000
Test Results:: NEGATIVE
Weight: 232 [lb_av]

## 2017-04-23 ENCOUNTER — Other Ambulatory Visit: Payer: Self-pay | Admitting: Certified Nurse Midwife

## 2017-04-23 DIAGNOSIS — Z3480 Encounter for supervision of other normal pregnancy, unspecified trimester: Secondary | ICD-10-CM

## 2017-04-29 ENCOUNTER — Ambulatory Visit (HOSPITAL_COMMUNITY)
Admission: RE | Admit: 2017-04-29 | Discharge: 2017-04-29 | Disposition: A | Discharge status: Home / Self Care | Payer: BC Managed Care – PPO | Source: Ambulatory Visit | Attending: Certified Nurse Midwife | Admitting: Certified Nurse Midwife

## 2017-04-29 ENCOUNTER — Encounter (HOSPITAL_COMMUNITY): Payer: Self-pay

## 2017-04-29 DIAGNOSIS — O99212 Obesity complicating pregnancy, second trimester: Secondary | ICD-10-CM | POA: Insufficient documentation

## 2017-04-29 DIAGNOSIS — O09522 Supervision of elderly multigravida, second trimester: Secondary | ICD-10-CM | POA: Insufficient documentation

## 2017-04-29 DIAGNOSIS — O09292 Supervision of pregnancy with other poor reproductive or obstetric history, second trimester: Secondary | ICD-10-CM | POA: Insufficient documentation

## 2017-04-29 DIAGNOSIS — Z3A18 18 weeks gestation of pregnancy: Secondary | ICD-10-CM | POA: Insufficient documentation

## 2017-04-29 DIAGNOSIS — Z3490 Encounter for supervision of normal pregnancy, unspecified, unspecified trimester: Secondary | ICD-10-CM

## 2017-04-29 DIAGNOSIS — Z3689 Encounter for other specified antenatal screening: Secondary | ICD-10-CM | POA: Diagnosis not present

## 2017-04-29 DIAGNOSIS — Z862 Personal history of diseases of the blood and blood-forming organs and certain disorders involving the immune mechanism: Secondary | ICD-10-CM | POA: Diagnosis not present

## 2017-04-30 ENCOUNTER — Other Ambulatory Visit (HOSPITAL_COMMUNITY): Payer: Self-pay | Admitting: *Deleted

## 2017-04-30 DIAGNOSIS — Z362 Encounter for other antenatal screening follow-up: Secondary | ICD-10-CM

## 2017-05-03 ENCOUNTER — Other Ambulatory Visit: Payer: Self-pay | Admitting: Certified Nurse Midwife

## 2017-05-03 DIAGNOSIS — Z3480 Encounter for supervision of other normal pregnancy, unspecified trimester: Secondary | ICD-10-CM

## 2017-05-10 ENCOUNTER — Other Ambulatory Visit: Payer: Self-pay

## 2017-05-10 ENCOUNTER — Encounter: Payer: Self-pay | Admitting: Certified Nurse Midwife

## 2017-05-10 ENCOUNTER — Ambulatory Visit (INDEPENDENT_AMBULATORY_CARE_PROVIDER_SITE_OTHER): Payer: BC Managed Care – PPO | Admitting: Certified Nurse Midwife

## 2017-05-10 VITALS — BP 109/70 | HR 96 | Wt 239.0 lb

## 2017-05-10 DIAGNOSIS — O26892 Other specified pregnancy related conditions, second trimester: Secondary | ICD-10-CM

## 2017-05-10 DIAGNOSIS — K219 Gastro-esophageal reflux disease without esophagitis: Secondary | ICD-10-CM

## 2017-05-10 DIAGNOSIS — O99612 Diseases of the digestive system complicating pregnancy, second trimester: Secondary | ICD-10-CM

## 2017-05-10 DIAGNOSIS — N949 Unspecified condition associated with female genital organs and menstrual cycle: Secondary | ICD-10-CM

## 2017-05-10 DIAGNOSIS — O09522 Supervision of elderly multigravida, second trimester: Secondary | ICD-10-CM

## 2017-05-10 DIAGNOSIS — Z8759 Personal history of other complications of pregnancy, childbirth and the puerperium: Secondary | ICD-10-CM

## 2017-05-10 DIAGNOSIS — E559 Vitamin D deficiency, unspecified: Secondary | ICD-10-CM

## 2017-05-10 DIAGNOSIS — Z3482 Encounter for supervision of other normal pregnancy, second trimester: Secondary | ICD-10-CM

## 2017-05-10 DIAGNOSIS — Z3480 Encounter for supervision of other normal pregnancy, unspecified trimester: Secondary | ICD-10-CM

## 2017-05-10 MED ORDER — COMFORT FIT MATERNITY SUPP LG MISC: 1.0000 [IU] | Freq: Every day | 0 refills | Status: DC

## 2017-05-10 MED ORDER — OMEPRAZOLE 20 MG PO CPDR: 20.0000 mg | DELAYED_RELEASE_CAPSULE | Freq: Two times a day (BID) | ORAL | 5 refills | Status: DC

## 2017-05-10 NOTE — Progress Notes (Signed)
   PRENATAL VISIT NOTE  Subjective:  Robin Duncan is a 37 y.o. [id number] at 54w2dbeing seen today for ongoing prenatal care.  She is currently monitored for the following issues for this low-risk pregnancy and has Supervision of normal pregnancy, antepartum; Advanced maternal age in multigravida; History of pregnancy induced hypertension; Sickle cell trait (HPacific; and Vitamin D deficiency on their problem list.  Patient reports backache, no bleeding, no contractions, no cramping and no leaking.  Contractions: Irritability. Vag. Bleeding: None.  Movement: Present. Denies leaking of fluid.   The following portions of the patient's history were reviewed and updated as appropriate: allergies, current medications, past family history, past medical history, past social history, past surgical history and problem list. Problem list updated.  Objective:   Vitals:   05/10/17 1433  BP: 109/70  Pulse: 96  Weight: 239 lb (108.4 kg)    Fetal Status: Fetal Heart Rate (bpm): 139; doppler Fundal Height: 20 cm Movement: Present     General:  Alert, oriented and cooperative. Patient is in no acute distress.  Skin: Skin is warm and dry. No rash noted.   Cardiovascular: Normal heart rate noted  Respiratory: Normal respiratory effort, no problems with respiration noted  Abdomen: Soft, gravid, appropriate for gestational age.  Pain/Pressure: Present     Pelvic: Cervical exam deferred        Extremities: Normal range of motion.  Edema: Trace  Mental Status: Normal mood and affect. Normal behavior. Normal judgment and thought content.   Assessment and Plan:  Pregnancy: [id number] 240w2d1. Supervision of other normal pregnancy, antepartum     Doing well. SW met with patient in office. F/U [place].   2. Multigravida of advanced maternal age in second trimester     <40 years  3. History of pregnancy induced hypertension     Normotensive today  4. Vitamin D deficiency     Taking weekly  vitamin D  5. Pelvic pressure in pregnancy, antepartum, second trimester      - Elastic Bandages & Supports (COMFORT FIT MATERNITY SUPP LG) MISC; 1 Units by Does not apply route daily.  Dispense: 1 each; Refill: 0  6. Gastroesophageal reflux during pregnancy, antepartum, second trimester      OTC Tums - omeprazole (PRILOSEC) 20 MG capsule; Take 1 capsule (20 mg total) by mouth 2 (two) times daily before a meal.  Dispense: 60 capsule; Refill: 5  Preterm labor symptoms and general obstetric precautions including but not limited to vaginal bleeding, contractions, leaking of fluid and fetal movement were reviewed in detail with the patient. Please refer to After Visit Summary for other counseling recommendations.  Return in about 1 month (around 06/07/2017) for ROAllyn Future Appointments  Date Time Provider DeHendricks5/02/2017  2:15 PM [facility] [place] Sofhia Ulibarri, CNM

## 2017-05-10 NOTE — Progress Notes (Signed)
C/o baby balling up into a knot after she eats x 2 weeks.  Having lots of pressure, [facility]

## 2017-05-27 ENCOUNTER — Encounter (HOSPITAL_COMMUNITY): Payer: Self-pay

## 2017-05-27 ENCOUNTER — Other Ambulatory Visit (HOSPITAL_COMMUNITY): Payer: Self-pay | Admitting: Obstetrics and Gynecology

## 2017-05-27 ENCOUNTER — Ambulatory Visit (HOSPITAL_COMMUNITY)
Admission: RE | Admit: 2017-05-27 | Discharge: 2017-05-27 | Disposition: A | Discharge status: Home / Self Care | Payer: BC Managed Care – PPO | Source: Ambulatory Visit | Attending: Certified Nurse Midwife | Admitting: Certified Nurse Midwife

## 2017-05-27 DIAGNOSIS — O09522 Supervision of elderly multigravida, second trimester: Secondary | ICD-10-CM | POA: Insufficient documentation

## 2017-05-27 DIAGNOSIS — Z3A22 22 weeks gestation of pregnancy: Secondary | ICD-10-CM

## 2017-05-27 DIAGNOSIS — O09292 Supervision of pregnancy with other poor reproductive or obstetric history, second trimester: Secondary | ICD-10-CM | POA: Diagnosis not present

## 2017-05-27 DIAGNOSIS — Z862 Personal history of diseases of the blood and blood-forming organs and certain disorders involving the immune mechanism: Secondary | ICD-10-CM | POA: Insufficient documentation

## 2017-05-27 DIAGNOSIS — Z362 Encounter for other antenatal screening follow-up: Secondary | ICD-10-CM

## 2017-05-27 DIAGNOSIS — O99212 Obesity complicating pregnancy, second trimester: Secondary | ICD-10-CM | POA: Insufficient documentation

## 2017-05-27 DIAGNOSIS — O09299 Supervision of pregnancy with other poor reproductive or obstetric history, unspecified trimester: Secondary | ICD-10-CM

## 2017-05-28 ENCOUNTER — Other Ambulatory Visit (HOSPITAL_COMMUNITY): Payer: Self-pay | Admitting: *Deleted

## 2017-05-28 DIAGNOSIS — Z362 Encounter for other antenatal screening follow-up: Secondary | ICD-10-CM

## 2017-06-07 ENCOUNTER — Ambulatory Visit (INDEPENDENT_AMBULATORY_CARE_PROVIDER_SITE_OTHER): Payer: BC Managed Care – PPO | Admitting: Certified Nurse Midwife

## 2017-06-07 ENCOUNTER — Encounter: Payer: Self-pay | Admitting: Certified Nurse Midwife

## 2017-06-07 VITALS — BP 126/81 | HR 109 | Wt 242.6 lb

## 2017-06-07 DIAGNOSIS — O09522 Supervision of elderly multigravida, second trimester: Secondary | ICD-10-CM

## 2017-06-07 DIAGNOSIS — D573 Sickle-cell trait: Secondary | ICD-10-CM

## 2017-06-07 DIAGNOSIS — Z3480 Encounter for supervision of other normal pregnancy, unspecified trimester: Secondary | ICD-10-CM

## 2017-06-07 DIAGNOSIS — E559 Vitamin D deficiency, unspecified: Secondary | ICD-10-CM

## 2017-06-07 DIAGNOSIS — Z8759 Personal history of other complications of pregnancy, childbirth and the puerperium: Secondary | ICD-10-CM

## 2017-06-07 NOTE — Progress Notes (Signed)
   PRENATAL VISIT NOTE  Subjective:  Robin Duncan is a 37 y.o. [id number] at [redacted]w[redacted]d being seen today for ongoing prenatal care.  She is currently monitored for the following issues for this low-risk pregnancy and has Supervision of normal pregnancy, antepartum; Advanced maternal age in multigravida; History of pregnancy induced hypertension; Sickle cell trait (HCC); and Vitamin D deficiency on their problem list.  Patient reports no complaints.  Contractions: Irritability. Vag. Bleeding: None.  Movement: Present. Denies leaking of fluid.   The following portions of the patient's history were reviewed and updated as appropriate: allergies, current medications, past family history, past medical history, past social history, past surgical history and problem list. Problem list updated.  Objective:   Vitals:   06/07/17 1433  BP: 126/81  Pulse: (!) 109  Weight: 242 lb 9.6 oz (110 kg)    Fetal Status: Fetal Heart Rate (bpm): 144; doppler Fundal Height: 24 cm Movement: Present     General:  Alert, oriented and cooperative. Patient is in no acute distress.  Skin: Skin is warm and dry. No rash noted.   Cardiovascular: Normal heart rate noted  Respiratory: Normal respiratory effort, no problems with respiration noted  Abdomen: Soft, gravid, appropriate for gestational age.  Pain/Pressure: Present     Pelvic: Cervical exam deferred        Extremities: Normal range of motion.  Edema: Trace  Mental Status: Normal mood and affect. Normal behavior. Normal judgment and thought content.   Assessment and Plan:  Pregnancy: [id number] at [redacted]w[redacted]d  1. Supervision of other normal pregnancy, antepartum     Doing well  2. History of pregnancy induced hypertension     Taking baby ASA  3. Multigravida of advanced maternal age in second trimester    <40 years  4. Vitamin D deficiency     Taking weekly vitamin D  5. Sickle cell trait (HCC)     - AMB MFM GENETICS REFERRAL  Preterm labor symptoms and  general obstetric precautions including but not limited to vaginal bleeding, contractions, leaking of fluid and fetal movement were reviewed in detail with the patient. Please refer to After Visit Summary for other counseling recommendations.  Return in about 1 month (around 07/05/2017) for ROB, 2 hr OGTT.  Future Appointments  Date Time Provider Department Center  07/07/2017  9:00 AM [facility] [place] 3 [facility]    Roe Coombs, CNM

## 2017-06-07 NOTE — Progress Notes (Signed)
No concerns today 

## 2017-06-11 ENCOUNTER — Other Ambulatory Visit: Payer: Self-pay

## 2017-06-11 ENCOUNTER — Encounter (HOSPITAL_COMMUNITY): Payer: Self-pay | Admitting: Emergency Medicine

## 2017-06-11 ENCOUNTER — Ambulatory Visit (HOSPITAL_COMMUNITY)
Admission: EM | Admit: 2017-06-11 | Discharge: 2017-06-11 | Disposition: A | Discharge status: Home / Self Care | Payer: BC Managed Care – PPO | Attending: Family Medicine | Admitting: Family Medicine

## 2017-06-11 DIAGNOSIS — B9789 Other viral agents as the cause of diseases classified elsewhere: Principal | ICD-10-CM

## 2017-06-11 DIAGNOSIS — Z3A25 25 weeks gestation of pregnancy: Secondary | ICD-10-CM

## 2017-06-11 DIAGNOSIS — J069 Acute upper respiratory infection, unspecified: Secondary | ICD-10-CM

## 2017-06-11 NOTE — Discharge Instructions (Signed)
Drink tea. rest Honey for cough Salt water nasal spray

## 2017-06-11 NOTE — ED Triage Notes (Signed)
The patient presented to the [facility] with a complaint of a cough and congestion x 3 days. 

## 2017-06-18 NOTE — ED Provider Notes (Signed)
[facility]    CSN: [id number] Arrival date & time: 06/11/17  1020     History   Chief Complaint Chief Complaint  Patient presents with  . Cough    HPI Robin Duncan is a 37 y.o. female.   HPI   Here for URI Cough cold and runny nose of 3 days duration No fever or sore throat Is pregnant and does not know what to take for symptoms No purulent nasal discharge or sputum No chest pain No nausea or vomiting   Past Medical History:  Diagnosis Date  . Bursitis   . Pregnancy induced hypertension   . Sickle cell trait (HCC)   . Vaginal Pap smear, abnormal     Patient Active Problem List   Diagnosis Date Noted  . Sickle cell trait (HCC) 02/18/2017  . Vitamin D deficiency 02/18/2017  . Supervision of normal pregnancy, antepartum 02/15/2017  . Advanced maternal age in multigravida 02/15/2017  . History of pregnancy induced hypertension 02/15/2017    Past Surgical History:  Procedure Laterality Date  . leep      OB History    Gravida  4   Para  2   Term  2   Preterm  0   AB  1   Living  2     SAB      TAB      Ectopic      Multiple      Live Births               Home Medications    Prior to Admission medications   Medication Sig Start Date End Date Taking? Authorizing Provider  Prenat-FeCbn-FeAspGl-FA-Omega (OB COMPLETE PETITE) 35-5-1-200 MG CAPS Take 1 tablet by mouth daily. 02/15/17  Yes Denney, Rodell Perna, CNM    Family History Family History  Problem Relation Age of Onset  . Diabetes Mother   . Hypertension Father     Social History Social History   Tobacco Use  . Smoking status: Current Some Day Smoker    Packs/day: 0.10    Types: Cigarettes  . Smokeless tobacco: Never Used  Substance Use Topics  . Alcohol use: Not Currently    Alcohol/week: 0.0 oz  . Drug use: No     Allergies   Patient has no known allergies.   Review of Systems Review of Systems  Constitutional: Negative for chills and  fever.  HENT: Positive for congestion, postnasal drip and rhinorrhea. Negative for ear pain and sore throat.   Eyes: Negative for pain and visual disturbance.  Respiratory: Positive for cough. Negative for shortness of breath.   Cardiovascular: Negative for chest pain and palpitations.  Gastrointestinal: Negative for abdominal pain and vomiting.  Genitourinary: Negative for dysuria and hematuria.  Musculoskeletal: Negative for arthralgias and back pain.  Skin: Negative for color change and rash.  Neurological: Negative for seizures and syncope.  All other systems reviewed and are negative.    Physical Exam Triage Vital Signs ED Triage Vitals  Enc Vitals Group     BP 06/11/17 1055 120/73     Pulse Rate 06/11/17 1055 98     Resp 06/11/17 1055 20     Temp 06/11/17 1055 98 F (36.7 C)     Temp Source 06/11/17 1055 Oral     SpO2 06/11/17 1055 99 %     Weight --      Height --      Head Circumference --  Peak Flow --      Pain Score 06/11/17 1053 6     Pain Loc --      Pain Edu? --      Excl. in GC? --    No data found.  Updated Vital Signs BP 120/73 (BP Location: Left Arm)   Pulse 98   Temp 98 F (36.7 C) (Oral)   Resp 20   LMP 11/20/2016 (Approximate) Comment: Pt unsure of LMP  SpO2 99%   Visual Acuity Right Eye Distance:   Left Eye Distance:   Bilateral Distance:    Right Eye Near:   Left Eye Near:    Bilateral Near:     Physical Exam  Constitutional: She appears well-developed and well-nourished. No distress.  HENT:  Head: Normocephalic and atraumatic.  Right Ear: External ear normal.  Left Ear: External ear normal.  Mouth/Throat: Oropharynx is clear and moist.  Clear rhinorhhea  Eyes: Pupils are equal, round, and reactive to light. Conjunctivae are normal.  Neck: Normal range of motion.  Cardiovascular: Normal rate, regular rhythm and normal heart sounds.  Pulmonary/Chest: Breath sounds normal. No respiratory distress. She has no wheezes. She has no  rales.  Abdominal: Soft. She exhibits no distension. There is no tenderness.  Musculoskeletal: Normal range of motion. She exhibits no edema.  Lymphadenopathy:    She has no cervical adenopathy.  Neurological: She is alert.  Skin: Skin is warm and dry.  Psychiatric: She has a normal mood and affect.     UC Treatments / Results  Labs (all labs ordered are listed, but only abnormal results are displayed) Labs Reviewed - No data to display  EKG None  Radiology No results found.  Procedures Procedures (including critical care time)  Medications Ordered in UC Medications - No data to display  Initial Impression / Assessment and Plan / UC Course  I have reviewed the triage vital signs and the nursing notes.  Pertinent labs & imaging results that were available during my care of the patient were reviewed by me and considered in my medical decision making (see chart for details).    Final Clinical Impressions(s) / UC Diagnoses   Final diagnoses:  Viral URI with cough  [redacted] weeks gestation of pregnancy     Discharge Instructions     Drink tea. rest Honey for cough Salt water nasal spray   ED Prescriptions    None     Controlled Substance Prescriptions [place] Controlled Substance Registry consulted? Not Applicable   Eustace Moore, MD 06/18/17 1019

## 2017-06-28 DIAGNOSIS — Z3482 Encounter for supervision of other normal pregnancy, second trimester: Secondary | ICD-10-CM

## 2017-07-07 ENCOUNTER — Ambulatory Visit (HOSPITAL_COMMUNITY)
Admission: RE | Admit: 2017-07-07 | Discharge: 2017-07-07 | Disposition: A | Discharge status: Home / Self Care | Payer: BC Managed Care – PPO | Source: Ambulatory Visit | Attending: Certified Nurse Midwife | Admitting: Certified Nurse Midwife

## 2017-07-07 ENCOUNTER — Telehealth: Payer: Self-pay

## 2017-07-07 ENCOUNTER — Encounter (HOSPITAL_COMMUNITY): Payer: Self-pay

## 2017-07-07 DIAGNOSIS — Z862 Personal history of diseases of the blood and blood-forming organs and certain disorders involving the immune mechanism: Secondary | ICD-10-CM | POA: Insufficient documentation

## 2017-07-07 DIAGNOSIS — Z3A28 28 weeks gestation of pregnancy: Secondary | ICD-10-CM | POA: Diagnosis not present

## 2017-07-07 DIAGNOSIS — O99213 Obesity complicating pregnancy, third trimester: Secondary | ICD-10-CM | POA: Insufficient documentation

## 2017-07-07 DIAGNOSIS — O09293 Supervision of pregnancy with other poor reproductive or obstetric history, third trimester: Secondary | ICD-10-CM | POA: Insufficient documentation

## 2017-07-07 DIAGNOSIS — Z362 Encounter for other antenatal screening follow-up: Secondary | ICD-10-CM | POA: Insufficient documentation

## 2017-07-07 DIAGNOSIS — O09522 Supervision of elderly multigravida, second trimester: Secondary | ICD-10-CM

## 2017-07-07 DIAGNOSIS — O09523 Supervision of elderly multigravida, third trimester: Secondary | ICD-10-CM | POA: Insufficient documentation

## 2017-07-07 DIAGNOSIS — O352XX0 Maternal care for (suspected) hereditary disease in fetus, not applicable or unspecified: Secondary | ICD-10-CM | POA: Insufficient documentation

## 2017-07-07 NOTE — Telephone Encounter (Signed)
Pt came by the office and wanted to speak to a nurse about a cold and nasal congestion symptoms shes been experiencing for a couple days. Pt was given list of safe meds during pregnancy and advised to start mucinex and a nasal spray and if the symptoms do not improve we will talk to provider about it at her ROB visit in two days. Pt verbalized understanding.

## 2017-07-07 NOTE — Progress Notes (Signed)
Appointment Date: 07/07/2017 Referred By: Roe Coombsenney, Rachelle A, CNM DOB: October 11, 1980 Attending: Particia NearingMartha Decker, MD  Ms. Nuala L Roxan HockeyRobinson was seen for genetic counseling regarding a maternal age of 37 y.o. 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   We reviewed chromosomes, nondisjunction, and the associated 1 in 105 risk for fetal aneuploidy related to a maternal age of 37 y.o. at 2474w4d weeks gestation.  She was counseled that the risk for aneuploidy decreases as gestational age increases, accounting for those pregnancies which spontaneously abort.  We specifically discussed Down syndrome (trisomy 6021), trisomies 4313 and 4418, and sex chromosome aneuploidies (47,XXX and 47,XXY) including the common features and prognoses of each.   We also reviewed the results of Ms. Uplinger's non-invasive prenatal screening (NIPS) and ultrasound.  We discussed that NIPS analyzes placental cell free DNA in maternal circulation to evaluate for the presence of extra chromosome conditions.  Thus, it is able to provide risk assessment for specific chromosome conditions, but is not diagnostic.  Specifically, Ms. Roxan Hockeyobinson had [employer].  Based on this screen, the chance for her baby to have aneuploidy for chromosomes 13, 18, 21, X or Y was reduced to less than 1 in [id number].  She was counseled that 50-80% of fetuses with Down syndrome and up to 90% of fetuses with trisomies 13 and 18, when well visualized, have detectable anomalies or soft  markers by ultrasound.  We discussed that no specific concerns were identified by ultrasound at this time.    We also discussed the availability of diagnostic testing by way of amniocentesis.  We reviewed the risks, benefits and limitations of amniocentesis including the approximate 1 in 300-500 risk for pregnancy complications following amniocentesis. We discussed the possible results that the tests might provide including: positive, negative, unanticipated, and no result. Finally, she was counseled regarding the cost of each option and potential out of pocket expenses.  After reviewing the above results and the available options, Ms. Roxan HockeyRobinson expressed that she is not interested in pursuing diagnostic testing at this time or in the future, given the associated risk of complications.  She understands that ultrasound and NIPS cannot rule out all birth defects or genetic syndromes.    Ms. Roxan HockeyRobinson was provided with written information regarding cystic fibrosis (CF), spinal muscular atrophy (SMA) and hemoglobinopathies including the carrier frequency, availability of carrier screening and prenatal diagnosis if indicated.  In addition, we discussed that CF and hemoglobinopathies are routinely screened for as part of the [place] newborn screening panel.  After further discussion, she declined screening for CF and SMA today.  Ms. 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  We discussed that Sickle Cell anemia (SCA) is a hemoglobinopathy in which there is an inherited structural abnormality in one of the globin chains, specifically the beta globin chain.  It is characterized by episodes of vaso-occlusive crisis and chronic anemia due to the tendency of red blood cells to become deformed under conditions of decreased oxygen tension.  The basic defect in SCA involves the change of  a single amino acid altering the configuration of the hemoglobin molecule causing the cells to sickle and to obstruct  blood flow in small vessels.  Ischemia of tissues and organs results.  Increased cell fragility with increased phagocytosis and splenic sequestration of the fragile cells produces anemia.  SCA (Hgb SS) is inherited as an autosomal recessive condition.  The carrier state is Hgb AS.  Ms. MYSHA PEELER was counseled about the 1 in 4 (25%) chance with each pregnancy to have a child with SCA when both parents are carriers for SCA (Hgb AS).    We reviewed the recessive inheritance of SCA and the importance of understanding the carrier status of the father of the baby in order to accurately predict the risk of a hemoglobinopathy in the fetus. Ms. Otoole believes that the father of the baby  has had screening for SCA in the past and that he is not a carrier for sickle cell anemia. She did not have medical records available to verify the reported information. We discussed that other hemoglobin variants (hemoglobin C, beta thalassemia, etc), when combined with hemoglobin S, can cause a medically significant hemoglobinopathy.  We discussed the availability of screening for hemoglobin disorders through the county health departments. She understands that there is an approximate 1 in 12 chance for a person of African American ancestry to be a carrier for a hemoglobin variant.  Without testing for the father of the baby, the chance to have an affected child is approximately 1 in 3.  We reviewed that early diagnosis of SCA is facilitated by newborn screening before the onset of symptoms.  Clinical manifestations usually present in the first or second year of life.  She understands that although the ultrasound may appear normal, the risk of anomalies cannot be completely eliminated, and that a baby with SCA would not appear different on a prenatal ultrasound.   Both family histories were reviewed and found to be noncontributory for birth defects, mental retardation, and known genetic conditions. Without further  information regarding the provided family history, an accurate genetic risk cannot be calculated. Further genetic counseling is warranted if more information is obtained.  Ms. Bickham denied exposure to environmental toxins or chemical agents. She denied the use of alcohol, tobacco or street drugs. She denied significant viral illnesses during the course of her pregnancy. Her medical and surgical histories were noncontributory.   I counseled this couple regarding the above risks and available options.  The approximate face-to-face time with the genetic counselor was 40 minutes.  Mady Gemma, MS,  Certified Genetic Counselor

## 2017-07-09 ENCOUNTER — Other Ambulatory Visit: Payer: BC Managed Care – PPO

## 2017-07-09 ENCOUNTER — Ambulatory Visit (INDEPENDENT_AMBULATORY_CARE_PROVIDER_SITE_OTHER): Payer: BC Managed Care – PPO | Admitting: Certified Nurse Midwife

## 2017-07-09 ENCOUNTER — Encounter: Payer: Self-pay | Admitting: Certified Nurse Midwife

## 2017-07-09 VITALS — BP 116/75 | HR 102 | Wt 242.0 lb

## 2017-07-09 DIAGNOSIS — Z3480 Encounter for supervision of other normal pregnancy, unspecified trimester: Secondary | ICD-10-CM

## 2017-07-09 DIAGNOSIS — O09523 Supervision of elderly multigravida, third trimester: Secondary | ICD-10-CM

## 2017-07-09 DIAGNOSIS — Z3483 Encounter for supervision of other normal pregnancy, third trimester: Secondary | ICD-10-CM

## 2017-07-09 NOTE — Progress Notes (Signed)
Patient reports good fetal movement with some occasional uterine irritability.

## 2017-07-09 NOTE — Patient Instructions (Addendum)
Places to have your son circumcised:    [facility] [phone] $480 while you are in hospital  [facility] [phone] $244 by 4 wks  [facility] [phone] $175 by 2 wks  [facility] [phone] $250 by 7 days [facility] [phone] $269 by 4 wks  These prices sometimes change but are roughly what you can expect to pay. Please call and confirm pricing.   Circumcision is considered an elective/non-medically necessary procedure. There are many reasons parents decide to have their sons circumsized. During the first year of life circumcised males have a reduced risk of urinary tract infections but after this year the rates between circumcised males and uncircumcised males are the same.  It is safe to have your son circumcised outside of the hospital and the places above perform them regularly.   Deciding about Circumcision in Baby Boys  (Up-to-date The Basics)  What is circumcision?  Circumcision is a surgery that removes the skin that covers the tip of the penis, called the "foreskin" Circumcision is usually done when a boy is between 761 and 1410 days old. In the [place], circumcision is common. In some other countries, fewer boys are circumcised. Circumcision is a common tradition in some religions.  Should I have my baby boy circumcised?  There is no easy answer. Circumcision has some benefits. But it also has risks. After talking with your doctor, you will have to decide for yourself what is right for your family.  What are the benefits of circumcision?  Circumcised boys seem to have slightly lower rates of: ?Urinary tract infections ?Swelling of the opening at the tip of the penis Circumcised men seem to have slightly lower rates of: ?Urinary tract infections ?Swelling of the opening at the tip of the penis ?Penis  cancer ?HIV and other infections that you catch during sex ?Cervical cancer in the women they have sex with Even so, in the [place], the risks of these problems are small - even in boys and men who have not been circumcised. Plus, boys and men who are not circumcised can reduce these extra risks by: ?Cleaning their penis well ?Using condoms during sex  What are the risks of circumcision?  Risks include: ?Bleeding or infection from the surgery ?Damage to or amputation of the penis ?A chance that the doctor will cut off too much or not enough of the foreskin ?A chance that sex won't feel as good later in life Only about 1 out of every 200 circumcisions leads to problems. There is also a chance that your health insurance won't pay for circumcision.  How is circumcision done in baby boys?  First, the baby gets medicine for pain relief. This might be a cream on the skin or a shot into the base of the penis. Next, the doctor cleans the baby's penis well. Then he or she uses special tools to cut off the foreskin. Finally, the doctor wraps a bandage (called gauze) around the baby's penis. If you have your baby circumcised, his doctor or nurse will give you instructions on how to care for him after the surgery. It is important that you follow those instructions carefully.   Third Trimester of Pregnancy The third trimester is from week 29 through week 42, months 7 through 9. This trimester is when your unborn baby (fetus) is growing very fast. At the end of the ninth month, the unborn baby is about 20 inches in length. It weighs about 6-10 pounds. Follow these instructions at home:  Avoid  all smoking, herbs, and alcohol. Avoid drugs not approved by your doctor.  Do not use any tobacco products, including cigarettes, chewing tobacco, and electronic cigarettes. If you need help quitting, ask your doctor. You may get counseling or other support to help you quit.  Only take medicine as told by  your doctor. Some medicines are safe and some are not during pregnancy.  Exercise only as told by your doctor. Stop exercising if you start having cramps.  Eat regular, healthy meals.  Wear a good support bra if your breasts are tender.  Do not use hot tubs, steam rooms, or saunas.  Wear your seat belt when driving.  Avoid raw meat, uncooked cheese, and liter boxes and soil used by cats.  Take your prenatal vitamins.  Take 1500-2000 milligrams of calcium daily starting at the 20th week of pregnancy until you deliver your baby.  Try taking medicine that helps you poop (stool softener) as needed, and if your doctor approves. Eat more fiber by eating fresh fruit, vegetables, and whole grains. Drink enough fluids to keep your pee (urine) clear or pale yellow.  Take warm water baths (sitz baths) to soothe pain or discomfort caused by hemorrhoids. Use hemorrhoid cream if your doctor approves.  If you have puffy, bulging veins (varicose veins), wear support hose. Raise (elevate) your feet for 15 minutes, 3-4 times a day. Limit salt in your diet.  Avoid heavy lifting, wear low heels, and sit up straight.  Rest with your legs raised if you have leg cramps or low back pain.  Visit your dentist if you have not gone during your pregnancy. Use a soft toothbrush to brush your teeth. Be gentle when you floss.  You can have sex (intercourse) unless your doctor tells you not to.  Do not travel far distances unless you must. Only do so with your doctor's approval.  Take prenatal classes.  Practice driving to the hospital.  Pack your hospital bag.  Prepare the baby's room.  Go to your doctor visits. Get help if:  You are not sure if you are in labor or if your water has broken.  You are dizzy.  You have mild cramps or pressure in your lower belly (abdominal).  You have a nagging pain in your belly area.  You continue to feel sick to your stomach (nauseous), throw up (vomit), or have  watery poop (diarrhea).  You have bad smelling fluid coming from your vagina.  You have pain with peeing (urination). Get help right away if:  You have a fever.  You are leaking fluid from your vagina.  You are spotting or bleeding from your vagina.  You have severe belly cramping or pain.  You lose or gain weight rapidly.  You have trouble catching your breath and have chest pain.  You notice sudden or extreme puffiness (swelling) of your face, hands, ankles, feet, or legs.  You have not felt the baby move in over an hour.  You have severe headaches that do not go away with medicine.  You have vision changes. This information is not intended to replace advice given to you by your health care provider. Make sure you discuss any questions you have with your health care provider. Document Released: 04/08/2009 Document Revised: 06/20/2015 Document Reviewed: 03/15/2012 Elsevier Interactive Patient Education  2017 [employer].

## 2017-07-09 NOTE — Progress Notes (Signed)
   PRENATAL VISIT NOTE  Subjective:  Robin Duncan is a 37 y.o. [id number] at 5148w6d being seen today for ongoing prenatal care.  She is currently monitored for the following issues for this low-risk pregnancy and has Supervision of normal pregnancy, antepartum; Advanced maternal age in multigravida, second trimester; History of pregnancy induced hypertension; Sickle cell trait (HCC); Vitamin D deficiency; [redacted] weeks gestation of pregnancy; and Hereditary disease in family possibly affecting fetus, affecting management of mother, antepartum condition or complication, not applicable or unspecified fetus on their problem list.  Patient reports no complaints.  Contractions: Irritability. Vag. Bleeding: None.  Movement: Present. Denies leaking of fluid.   The following portions of the patient's history were reviewed and updated as appropriate: allergies, current medications, past family history, past medical history, past social history, past surgical history and problem list. Problem list updated.  Objective:   Vitals:   07/09/17 0918  BP: 116/75  Pulse: (!) 102  Weight: 242 lb (109.8 kg)    Fetal Status: Fetal Heart Rate (bpm): 152 Fundal Height: 30 cm Movement: Present     General:  Alert, oriented and cooperative. Patient is in no acute distress.  Skin: Skin is warm and dry. No rash noted.   Cardiovascular: Normal heart rate noted  Respiratory: Normal respiratory effort, no problems with respiration noted  Abdomen: Soft, gravid, appropriate for gestational age.  Pain/Pressure: Absent     Pelvic: Cervical exam deferred        Extremities: Normal range of motion.  Edema: Trace  Mental Status: Normal mood and affect. Normal behavior. Normal judgment and thought content.   Assessment and Plan:  Pregnancy: [id number] at 7448w6d  1. Supervision of other normal pregnancy, antepartum -Patient doing well, no complaints  -Questions answered about circumcision with cost of procedure and location-  information given in AVS.  - Glucose Tolerance, 2 Hours w/1 Hour - HIV antibody (with reflex) - RPR - CBC  2. AMA (advanced maternal age) multigravida 35+, third trimester  Preterm labor symptoms and general obstetric precautions including but not limited to vaginal bleeding, contractions, leaking of fluid and fetal movement were reviewed in detail with the patient. Please refer to After Visit Summary for other counseling recommendations.  Return in about 2 weeks (around 07/23/2017) for ROB.  Future Appointments  Date Time Provider Department Center  07/22/2017  9:15 AM Brock BadHarper, Charles A, MD [facility] None    Sharyon CableVeronica C Amyra Vantuyl, CNM

## 2017-07-10 LAB — CBC
Hematocrit: 35 % (ref 34.0–46.6)
Hemoglobin: 11.6 g/dL (ref 11.1–15.9)
MCH: 26.3 pg — ABNORMAL LOW (ref 26.6–33.0)
MCHC: 33.1 g/dL (ref 31.5–35.7)
MCV: 79 fL (ref 79–97)
Platelets: 216 10*3/uL (ref 150–450)
RBC: 4.41 x10E6/uL (ref 3.77–5.28)
RDW: 14.8 % (ref 12.3–15.4)
WBC: 7.4 10*3/uL (ref 3.4–10.8)

## 2017-07-10 LAB — RPR: RPR Ser Ql: NONREACTIVE

## 2017-07-10 LAB — GLUCOSE TOLERANCE, 2 HOURS W/ 1HR
Glucose, 1 hour: 169 mg/dL (ref 65–179)
Glucose, 2 hour: 142 mg/dL (ref 65–152)
Glucose, Fasting: 80 mg/dL (ref 65–91)

## 2017-07-10 LAB — HIV ANTIBODY (ROUTINE TESTING W REFLEX): HIV Screen 4th Generation wRfx: NONREACTIVE

## 2017-07-22 ENCOUNTER — Other Ambulatory Visit: Payer: Self-pay

## 2017-07-22 ENCOUNTER — Encounter: Payer: Self-pay | Admitting: Obstetrics

## 2017-07-22 ENCOUNTER — Ambulatory Visit (INDEPENDENT_AMBULATORY_CARE_PROVIDER_SITE_OTHER): Payer: BC Managed Care – PPO | Admitting: Obstetrics

## 2017-07-22 VITALS — BP 114/74 | HR 100 | Wt 242.6 lb

## 2017-07-22 DIAGNOSIS — K5900 Constipation, unspecified: Secondary | ICD-10-CM

## 2017-07-22 DIAGNOSIS — D573 Sickle-cell trait: Secondary | ICD-10-CM

## 2017-07-22 DIAGNOSIS — O09523 Supervision of elderly multigravida, third trimester: Secondary | ICD-10-CM

## 2017-07-22 DIAGNOSIS — O26899 Other specified pregnancy related conditions, unspecified trimester: Secondary | ICD-10-CM

## 2017-07-22 DIAGNOSIS — O26893 Other specified pregnancy related conditions, third trimester: Secondary | ICD-10-CM

## 2017-07-22 DIAGNOSIS — R12 Heartburn: Secondary | ICD-10-CM

## 2017-07-22 DIAGNOSIS — O99013 Anemia complicating pregnancy, third trimester: Secondary | ICD-10-CM

## 2017-07-22 DIAGNOSIS — O99619 Diseases of the digestive system complicating pregnancy, unspecified trimester: Secondary | ICD-10-CM

## 2017-07-22 MED ORDER — MAGNESIUM HYDROXIDE 400 MG/5ML PO SUSP: 30.0000 mL | Freq: Every evening | ORAL | 11 refills | Status: DC | PRN

## 2017-07-22 MED ORDER — RANITIDINE HCL 150 MG PO TABS: 150.0000 mg | ORAL_TABLET | Freq: Two times a day (BID) | ORAL | 5 refills | Status: DC

## 2017-07-22 MED ORDER — POLYETHYLENE GLYCOL 3350 17 G PO PACK
17.0000 g | PACK | Freq: Every day | ORAL | 5 refills | Status: DC
Start: 2017-07-22 — End: 2017-09-15

## 2017-07-22 MED ORDER — DOCUSATE SODIUM 100 MG PO CAPS: 100.0000 mg | ORAL_CAPSULE | Freq: Two times a day (BID) | ORAL | 11 refills | Status: DC

## 2017-07-22 NOTE — Progress Notes (Signed)
Subjective:  Robin Duncan is a 37 y.o. [id number] at 4860w5d being seen today for ongoing prenatal care.  She is currently monitored for the following issues for this high-risk pregnancy and has Supervision of normal pregnancy, antepartum; Advanced maternal age in multigravida, second trimester; History of pregnancy induced hypertension; Sickle cell trait (HCC); Vitamin D deficiency; [redacted] weeks gestation of pregnancy; and Hereditary disease in family possibly affecting fetus, affecting management of mother, antepartum condition or complication, not applicable or unspecified fetus on their problem list.  Patient reports heartburn.  Contractions: Irritability. Vag. Bleeding: None.  Movement: Present. Denies leaking of fluid.   The following portions of the patient's history were reviewed and updated as appropriate: allergies, current medications, past family history, past medical history, past social history, past surgical history and problem list. Problem list updated.  Objective:   Vitals:   07/22/17 0939  BP: 114/74  Pulse: 100  Weight: 242 lb 9.6 oz (110 kg)    Fetal Status:     Movement: Present     General:  Alert, oriented and cooperative. Patient is in no acute distress.  Skin: Skin is warm and dry. No rash noted.   Cardiovascular: Normal heart rate noted  Respiratory: Normal respiratory effort, no problems with respiration noted  Abdomen: Soft, gravid, appropriate for gestational age. Pain/Pressure: Absent     Pelvic:  Cervical exam deferred        Extremities: Normal range of motion.  Edema: Trace  Mental Status: Normal mood and affect. Normal behavior. Normal judgment and thought content.   Urinalysis:      Assessment and Plan:  Pregnancy: [id number] at 5460w5d  1. AMA (advanced maternal age) multigravida 35+, third trimester  2. Sickle cell trait (HCC)  3. Heartburn during pregnancy, antepartum Rx: - ranitidine (ZANTAC) 150 MG tablet; Take 1 tablet (150 mg total) by mouth 2  (two) times daily.  Dispense: 60 tablet; Refill: 5  4. Constipation during pregnancy, antepartum Rx: - docusate sodium (COLACE) 100 MG capsule; Take 1 capsule (100 mg total) by mouth 2 (two) times daily.  Dispense: 60 capsule; Refill: 11 - magnesium hydroxide (MILK OF MAGNESIA) 400 MG/5ML suspension; Take 30 mLs by mouth at bedtime as needed for mild constipation or moderate constipation.  Dispense: 360 mL; Refill: 11 - polyethylene glycol (MIRALAX) packet; Take 17 g by mouth daily.  Dispense: 30 each; Refill: 5 Preterm labor symptoms and general obstetric precautions including but not limited to vaginal bleeding, contractions, leaking of fluid and fetal movement were reviewed in detail with the patient.  Please refer to After Visit Summary for other counseling recommendations.  Return in about 2 weeks (around 08/05/2017) for ROB.   Brock BadHarper, Landrum Carbonell A, MD

## 2017-07-22 NOTE — Patient Instructions (Signed)
Heartburn During Pregnancy °Heartburn is a type of pain or discomfort that can happen in the throat or chest. It is often described as a burning sensation. Heartburn is common during pregnancy because: °· A hormone (progesterone) that is released during pregnancy may relax the valve (lower esophageal sphincter, or LES) that separates the esophagus from the stomach. This allows stomach acid to move up into the esophagus, causing heartburn. °· The uterus gets larger and pushes up on the stomach, which pushes more acid into the esophagus. This is especially true in the later stages of pregnancy. ° °Heartburn usually goes away or gets better after giving birth. °What are the causes? °Heartburn is caused by stomach acid backing up into the esophagus (reflux). Reflux can be triggered by: °· Changing hormone levels. °· Large meals. °· Certain foods and beverages, such as coffee, chocolate, onions, and peppermint. °· Exercise. °· Increased stomach acid production. ° °What increases the risk? °You are more likely to experience heartburn during pregnancy if you: °· Had heartburn prior to becoming pregnant. °· Have been pregnant more than once before. °· Are overweight or obese. ° °The likelihood that you will get heartburn also increases as you get farther along in your pregnancy, especially during the last trimester. °What are the signs or symptoms? °Symptoms of this condition include: °· Burning pain in the chest or lower throat. °· Bitter taste in the mouth. °· Coughing. °· Problems swallowing. °· Vomiting. °· Hoarse voice. °· Asthma. ° °Symptoms may get worse when you lie down or bend over. Symptoms are often worse at night. °How is this diagnosed? °This condition is diagnosed based on: °· Your medical history. °· Your symptoms. °· Blood tests to check for a certain type of bacteria associated with heartburn. °· Whether taking heartburn medicine relieves your symptoms. °· Examination of the stomach and esophagus using a  tube with a light and camera on the end (endoscopy). ° °How is this treated? °Treatment varies depending on how severe your symptoms are. Your health care provider may recommend: °· Over-the-counter medicines (antacids or acid reducers) for mild heartburn. °· Prescription medicines to decrease stomach acid or to protect your stomach lining. °· Certain changes in your diet. °· Raising the head of your bed so it is higher than the foot of the bed. This helps prevent stomach acid from backing up into the esophagus when you are lying down. ° °Follow these instructions at home: °Eating and drinking °· Do not drink alcohol during your pregnancy. °· Identify foods and beverages that make your symptoms worse, and avoid them. °· Beverages that you may want to avoid include: °? Coffee and tea (with or without caffeine). °? Energy drinks and sports drinks. °? Carbonated drinks or sodas. °? Citrus fruit juices. °· Foods that you may want to avoid include: °? Chocolate and cocoa. °? Peppermint and mint flavorings. °? Garlic, onions, and horseradish. °? Spicy and acidic foods, including peppers, chili powder, curry powder, vinegar, hot sauces, and barbecue sauce. °? Citrus fruits, such as oranges, lemons, and limes. °? Tomato-based foods, such as red sauce, chili, and salsa. °? Fried and fatty foods, such as donuts, french fries, potato chips, and high-fat dressings. °? High-fat meats, such as hot dogs, cold cuts, sausage, ham, and bacon. °? High-fat dairy items, such as whole milk, butter, and cheese. °· Eat small, frequent meals instead of large meals. °· Avoid drinking large amounts of liquid with your meals. °· Avoid eating meals during the 2-3 hours before   bedtime. °· Avoid lying down right after you eat. °· Do not exercise right after you eat. °Medicines °· Take over-the-counter and prescription medicines only as told by your health care provider. °· Do not take aspirin, ibuprofen, or other NSAIDs unless your health care  provider tells you to do that. °· You may be instructed to avoid medicines that contain sodium bicarbonate. °General instructions °· If directed, raise the head of your bed about 6 inches (15 cm) by putting blocks under the legs. Sleeping with more pillows does not effectively relieve heartburn because it only changes the position of your head. °· Do not use any products that contain nicotine or tobacco, such as cigarettes and e-cigarettes. If you need help quitting, ask your health care provider. °· Wear loose-fitting clothing. °· Try to reduce your stress, such as with yoga or meditation. If you need help managing stress, ask your health care provider. °· Maintain a healthy weight. If you are overweight, work with your health care provider to safely lose weight. °· Keep all follow-up visits as told by your health care provider. This is important. °Contact a health care provider if: °· You develop new symptoms. °· Your symptoms do not improve with treatment. °· You have unexplained weight loss. °· You have difficulty swallowing. °· You make loud sounds when you breathe (wheeze). °· You have a cough that does not go away. °· You have frequent heartburn for more than 2 weeks. °· You have nausea or vomiting that does not get better with treatment. °· You have pain in your abdomen. °Get help right away if: °· You have severe chest pain that spreads to your arm, neck, or jaw. °· You feel sweaty, dizzy, or light-headed. °· You have shortness of breath. °· You have pain when swallowing. °· You vomit, and your vomit looks like blood or coffee grounds. °· Your stool is bloody or black. °This information is not intended to replace advice given to you by your health care provider. Make sure you discuss any questions you have with your health care provider. °Document Released: 01/10/2000 Document Revised: 09/30/2015 Document Reviewed: 09/30/2015 °Elsevier Interactive Patient Education © 2018 [employer] Inc[employer] ° °

## 2017-08-05 ENCOUNTER — Ambulatory Visit (INDEPENDENT_AMBULATORY_CARE_PROVIDER_SITE_OTHER): Payer: BC Managed Care – PPO | Admitting: Obstetrics

## 2017-08-05 ENCOUNTER — Encounter: Payer: Self-pay | Admitting: Obstetrics

## 2017-08-05 VITALS — BP 111/79 | HR 100 | Wt 248.5 lb

## 2017-08-05 DIAGNOSIS — O09529 Supervision of elderly multigravida, unspecified trimester: Secondary | ICD-10-CM

## 2017-08-05 DIAGNOSIS — Z8759 Personal history of other complications of pregnancy, childbirth and the puerperium: Secondary | ICD-10-CM

## 2017-08-05 NOTE — Progress Notes (Signed)
Subjective:  Robin Duncan is Duncan 37 y.o. [id number] at 6671w5d being seen today for ongoing prenatal care.  She is currently monitored for the following issues for this high-risk pregnancy and has Supervision of normal pregnancy, antepartum; Advanced maternal age in multigravida, second trimester; History of pregnancy induced hypertension; Sickle cell trait (HCC); Vitamin D deficiency; [redacted] weeks gestation of pregnancy; and Hereditary disease in family possibly affecting fetus, affecting management of mother, antepartum condition or complication, not applicable or unspecified fetus on their problem list.  Patient reports occasional contractions.  Contractions: Irregular. Vag. Bleeding: None.  Movement: Present. Denies leaking of fluid.   The following portions of the patient's history were reviewed and updated as appropriate: allergies, current medications, past family history, past medical history, past social history, past surgical history and problem list. Problem list updated.  Objective:   Vitals:   08/05/17 1013  BP: 111/79  Pulse: 100  Weight: 248 lb 8 oz (112.7 kg)    Fetal Status: Fetal Heart Rate (bpm): 150   Movement: Present     General:  Alert, oriented and cooperative. Patient is in no acute distress.  Skin: Skin is warm and dry. No rash noted.   Cardiovascular: Normal heart rate noted  Respiratory: Normal respiratory effort, no problems with respiration noted  Abdomen: Soft, gravid, appropriate for gestational age. Pain/Pressure: Absent     Pelvic:  Cervical exam deferred        Extremities: Normal range of motion.  Edema: Trace  Mental Status: Normal mood and affect. Normal behavior. Normal judgment and thought content.   Urinalysis:      Assessment and Plan:  Pregnancy: [id number] at 7171w5d  1. Supervision of elderly multigravida, antepartum - doing well  2. History of pregnancy induced hypertension - stable BP's  Preterm labor symptoms and general obstetric  precautions including but not limited to vaginal bleeding, contractions, leaking of fluid and fetal movement were reviewed in detail with the patient. Please refer to After Visit Summary for other counseling recommendations.  Return in about 2 weeks (around 08/19/2017) for ROB.   Robin Duncan, Robin Remmers A, MD

## 2017-08-19 ENCOUNTER — Encounter: Payer: Self-pay | Admitting: Obstetrics

## 2017-08-19 ENCOUNTER — Ambulatory Visit (INDEPENDENT_AMBULATORY_CARE_PROVIDER_SITE_OTHER): Payer: BC Managed Care – PPO | Admitting: Obstetrics

## 2017-08-19 VITALS — BP 118/76 | HR 98 | Wt 251.4 lb

## 2017-08-19 DIAGNOSIS — O0990 Supervision of high risk pregnancy, unspecified, unspecified trimester: Secondary | ICD-10-CM

## 2017-08-19 DIAGNOSIS — Z3689 Encounter for other specified antenatal screening: Secondary | ICD-10-CM

## 2017-08-19 DIAGNOSIS — O0993 Supervision of high risk pregnancy, unspecified, third trimester: Secondary | ICD-10-CM

## 2017-08-19 DIAGNOSIS — O09529 Supervision of elderly multigravida, unspecified trimester: Secondary | ICD-10-CM

## 2017-08-19 DIAGNOSIS — O09523 Supervision of elderly multigravida, third trimester: Secondary | ICD-10-CM

## 2017-08-19 NOTE — Progress Notes (Signed)
Patient reports good fetal movement with irregular contractions. Pt complains of fingers swelling, denies any headaches, or dizziness.

## 2017-08-19 NOTE — Progress Notes (Signed)
Subjective:  Robin Duncan is a 37 y.o. [id number] at 3947w5d being seen today for ongoing prenatal care.  She is currently monitored for the following issues for this high-risk pregnancy and has Supervision of normal pregnancy, antepartum; Advanced maternal age in multigravida, second trimester; History of pregnancy induced hypertension; Sickle cell trait (HCC); Vitamin D deficiency; [redacted] weeks gestation of pregnancy; and Hereditary disease in family possibly affecting fetus, affecting management of mother, antepartum condition or complication, not applicable or unspecified fetus on their problem list.  Patient reports backache and occasional contractions.  Contractions: Irregular. Vag. Bleeding: None.  Movement: Present. Denies leaking of fluid.   The following portions of the patient's history were reviewed and updated as appropriate: allergies, current medications, past family history, past medical history, past social history, past surgical history and problem list. Problem list updated.  Objective:   Vitals:   08/19/17 0935  BP: 118/76  Pulse: 98  Weight: 251 lb 6.4 oz (114 kg)    Fetal Status:     Movement: Present     General:  Alert, oriented and cooperative. Patient is in no acute distress.  Skin: Skin is warm and dry. No rash noted.   Cardiovascular: Normal heart rate noted  Respiratory: Normal respiratory effort, no problems with respiration noted  Abdomen: Soft, gravid, appropriate for gestational age. Pain/Pressure: Absent     Pelvic:  Cervical exam deferred        Extremities: Normal range of motion.  Edema: Trace  Mental Status: Normal mood and affect. Normal behavior. Normal judgment and thought content.   Urinalysis:      Assessment and Plan:  Pregnancy: [id number] at 5047w5d  1. Supervision of high risk pregnancy, antepartum  2. Antepartum multigravida of advanced maternal age Rx: - US MFM FETAL BPP WO NON STRESS; Future - US MFM FETAL BPP WO NON STRESS; Future - US  MFM FETAL BPP WO NON STRESS; Future - US MFM FETAL BPP WO NON STRESS; Future - US MFM FETAL BPP WO NON STRESS; Future  Rx:   - WEEKLY NST'S  3. Encounter for ultrasound to assess interval growth of fetus Rx: - US MFM OB FOLLOW UP; Future   Preterm labor symptoms and general obstetric precautions including but not limited to vaginal bleeding, contractions, leaking of fluid and fetal movement were reviewed in detail with the patient. Please refer to After Visit Summary for other counseling recommendations.  Return in about 1 week (around 08/26/2017) for ROB.   Brock BadHarper, Kare Dado A, MD

## 2017-08-23 ENCOUNTER — Encounter: Payer: Self-pay | Admitting: Obstetrics and Gynecology

## 2017-08-23 ENCOUNTER — Ambulatory Visit (INDEPENDENT_AMBULATORY_CARE_PROVIDER_SITE_OTHER): Payer: BC Managed Care – PPO | Admitting: Obstetrics and Gynecology

## 2017-08-23 VITALS — BP 121/71 | HR 103 | Wt 252.8 lb

## 2017-08-23 DIAGNOSIS — Z8759 Personal history of other complications of pregnancy, childbirth and the puerperium: Secondary | ICD-10-CM

## 2017-08-23 DIAGNOSIS — O09522 Supervision of elderly multigravida, second trimester: Secondary | ICD-10-CM

## 2017-08-23 DIAGNOSIS — Z3480 Encounter for supervision of other normal pregnancy, unspecified trimester: Secondary | ICD-10-CM

## 2017-08-23 DIAGNOSIS — Z3483 Encounter for supervision of other normal pregnancy, third trimester: Secondary | ICD-10-CM | POA: Diagnosis not present

## 2017-08-23 DIAGNOSIS — Z3A35 35 weeks gestation of pregnancy: Secondary | ICD-10-CM

## 2017-08-23 DIAGNOSIS — O09523 Supervision of elderly multigravida, third trimester: Secondary | ICD-10-CM

## 2017-08-23 NOTE — Progress Notes (Signed)
Pt c/o swelling/tingling in hands and fingers.

## 2017-08-23 NOTE — Progress Notes (Signed)
   PRENATAL VISIT NOTE  Subjective:  Robin Duncan is a 37 y.o. [id number] at 7652w2d being seen today for ongoing prenatal care.  She is currently monitored for the following issues for this high-risk pregnancy and has Supervision of normal pregnancy, antepartum; Advanced maternal age in multigravida, second trimester; History of pregnancy induced hypertension; Sickle cell trait (HCC); Vitamin D deficiency; and Hereditary disease in family possibly affecting fetus, affecting management of mother, antepartum condition or complication, not applicable or unspecified fetus on their problem list.  Patient reports no complaints.  Contractions: Irregular. Vag. Bleeding: None.  Movement: Present. Denies leaking of fluid.   The following portions of the patient's history were reviewed and updated as appropriate: allergies, current medications, past family history, past medical history, past social history, past surgical history and problem list. Problem list updated.  Objective:   Vitals:   08/23/17 0908  BP: 121/71  Pulse: (!) 103  Weight: 252 lb 12.8 oz (114.7 kg)    Fetal Status: Fetal Heart Rate (bpm): NST  Fundal Height: 36 cm Movement: Present     General:  Alert, oriented and cooperative. Patient is in no acute distress.  Skin: Skin is warm and dry. No rash noted.   Cardiovascular: Normal heart rate noted  Respiratory: Normal respiratory effort, no problems with respiration noted  Abdomen: Soft, gravid, appropriate for gestational age.  Pain/Pressure: Absent     Pelvic: Cervical exam deferred        Extremities: Normal range of motion.  Edema: Trace  Mental Status: Normal mood and affect. Normal behavior. Normal judgment and thought content.   Assessment and Plan:  Pregnancy: [id number] at 1152w2d  1. Supervision of other normal pregnancy, antepartum Patient is doing well without complaints Plans Nexplanon for contraception Follow up growth ultrasound 8/1 Cultures next visit - Fetal  nonstress test  2. Advanced maternal age in multigravida, second trimester   3. History of pregnancy induced hypertension Normotensive throughout pregnancy Reviewed si/sx of preeclampsia Chart reviewed and no clear indication for weekly NST and BPP NST reviewed and reactive with baseline 144, mod variability, +accels, no decels  Preterm labor symptoms and general obstetric precautions including but not limited to vaginal bleeding, contractions, leaking of fluid and fetal movement were reviewed in detail with the patient. Please refer to After Visit Summary for other counseling recommendations.  No follow-ups on file.  Future Appointments  Date Time Provider Department Center  08/26/2017  1:00 PM [facility] US 3 [facility]  08/30/2017  9:15 AM Deldrick Linch, MD [facility] None  09/02/2017 11:15 AM [facility] US 4 [facility]  09/06/2017  9:00 AM Anyanwu, Jethro BastosUgonna A, MD [facility] None  09/09/2017 10:45 AM [facility] US 2 [facility]  09/13/2017  9:45 AM Mozetta Murfin, Gigi GinPeggy, MD [facility] None  09/16/2017 10:15 AM [facility] US 4 [facility]  09/20/2017  8:30 AM Conan Bowensavis, Kelly M, MD [facility] None  09/23/2017 10:15 AM [facility] US 4 [facility]    Catalina AntiguaPeggy Cabell Lazenby, MD

## 2017-08-26 ENCOUNTER — Encounter: Payer: BC Managed Care – PPO | Admitting: Obstetrics and Gynecology

## 2017-08-26 ENCOUNTER — Ambulatory Visit (HOSPITAL_COMMUNITY)
Admission: RE | Admit: 2017-08-26 | Discharge: 2017-08-26 | Disposition: A | Discharge status: Home / Self Care | Payer: BC Managed Care – PPO | Source: Ambulatory Visit | Attending: Obstetrics | Admitting: Obstetrics

## 2017-08-26 ENCOUNTER — Other Ambulatory Visit: Payer: Self-pay | Admitting: Obstetrics

## 2017-08-26 ENCOUNTER — Encounter (HOSPITAL_COMMUNITY): Payer: Self-pay

## 2017-08-26 DIAGNOSIS — O09293 Supervision of pregnancy with other poor reproductive or obstetric history, third trimester: Secondary | ICD-10-CM | POA: Insufficient documentation

## 2017-08-26 DIAGNOSIS — Z862 Personal history of diseases of the blood and blood-forming organs and certain disorders involving the immune mechanism: Secondary | ICD-10-CM

## 2017-08-26 DIAGNOSIS — Z3689 Encounter for other specified antenatal screening: Secondary | ICD-10-CM

## 2017-08-26 DIAGNOSIS — O09529 Supervision of elderly multigravida, unspecified trimester: Secondary | ICD-10-CM

## 2017-08-26 DIAGNOSIS — Z362 Encounter for other antenatal screening follow-up: Secondary | ICD-10-CM

## 2017-08-26 DIAGNOSIS — O99213 Obesity complicating pregnancy, third trimester: Secondary | ICD-10-CM

## 2017-08-26 DIAGNOSIS — O09523 Supervision of elderly multigravida, third trimester: Secondary | ICD-10-CM

## 2017-08-26 DIAGNOSIS — O09299 Supervision of pregnancy with other poor reproductive or obstetric history, unspecified trimester: Secondary | ICD-10-CM

## 2017-08-26 DIAGNOSIS — Z3A35 35 weeks gestation of pregnancy: Secondary | ICD-10-CM | POA: Diagnosis not present

## 2017-08-30 ENCOUNTER — Ambulatory Visit (INDEPENDENT_AMBULATORY_CARE_PROVIDER_SITE_OTHER): Payer: BC Managed Care – PPO | Admitting: Obstetrics and Gynecology

## 2017-08-30 ENCOUNTER — Other Ambulatory Visit (HOSPITAL_COMMUNITY)
Admission: RE | Admit: 2017-08-30 | Discharge: 2017-08-30 | Disposition: A | Discharge status: Home / Self Care | Payer: BC Managed Care – PPO | Source: Ambulatory Visit | Attending: Obstetrics and Gynecology | Admitting: Obstetrics and Gynecology

## 2017-08-30 ENCOUNTER — Encounter: Payer: Self-pay | Admitting: Obstetrics and Gynecology

## 2017-08-30 VITALS — BP 131/82 | HR 106 | Wt 256.4 lb

## 2017-08-30 DIAGNOSIS — Z3483 Encounter for supervision of other normal pregnancy, third trimester: Secondary | ICD-10-CM | POA: Insufficient documentation

## 2017-08-30 DIAGNOSIS — Z3A36 36 weeks gestation of pregnancy: Secondary | ICD-10-CM | POA: Insufficient documentation

## 2017-08-30 DIAGNOSIS — O09522 Supervision of elderly multigravida, second trimester: Secondary | ICD-10-CM

## 2017-08-30 DIAGNOSIS — Z3480 Encounter for supervision of other normal pregnancy, unspecified trimester: Secondary | ICD-10-CM

## 2017-08-30 DIAGNOSIS — Z8759 Personal history of other complications of pregnancy, childbirth and the puerperium: Secondary | ICD-10-CM

## 2017-08-30 LAB — OB RESULTS CONSOLE GBS: STREP GROUP B AG: NEGATIVE

## 2017-08-30 LAB — OB RESULTS CONSOLE GC/CHLAMYDIA: Gonorrhea: NEGATIVE

## 2017-08-30 NOTE — Progress Notes (Signed)
   PRENATAL VISIT NOTE  Subjective:  Robin Duncan is a 37 y.o. [id number] at 4310w2d being seen today for ongoing prenatal care.  She is currently monitored for the following issues for this high-risk pregnancy and has Supervision of normal pregnancy, antepartum; Advanced maternal age in multigravida, second trimester; History of pregnancy induced hypertension; Sickle cell trait (HCC); Vitamin D deficiency; and Hereditary disease in family possibly affecting fetus, affecting management of mother, antepartum condition or complication, not applicable or unspecified fetus on their problem list.  Patient reports no complaints.  Contractions: Irritability. Vag. Bleeding: None.  Movement: Present. Denies leaking of fluid.   The following portions of the patient's history were reviewed and updated as appropriate: allergies, current medications, past family history, past medical history, past social history, past surgical history and problem list. Problem list updated.  Objective:   Vitals:   08/30/17 0859  BP: 131/82  Pulse: (!) 106  Weight: 256 lb 6.4 oz (116.3 kg)    Fetal Status: Fetal Heart Rate (bpm): 145 Fundal Height: 36 cm Movement: Present  Presentation: Vertex  General:  Alert, oriented and cooperative. Patient is in no acute distress.  Skin: Skin is warm and dry. No rash noted.   Cardiovascular: Normal heart rate noted  Respiratory: Normal respiratory effort, no problems with respiration noted  Abdomen: Soft, gravid, appropriate for gestational age.  Pain/Pressure: Absent     Pelvic: Cervical exam performed Dilation: 1 Effacement (%): 50 Station: -3  Extremities: Normal range of motion.  Edema: Trace  Mental Status: Normal mood and affect. Normal behavior. Normal judgment and thought content.   Assessment and Plan:  Pregnancy: [id number] at 510w2d  1. Supervision of other normal pregnancy, antepartum Patient is doing well without complaints Cultures today - Culture, beta strep  (group b only) - Cervicovaginal ancillary only - Fetal nonstress test  2. Advanced maternal age in multigravida, second trimester Continue antenatal testing NST reviewed and reactive with baseline 145, mod variability, +accels, no decels  3. History of pregnancy induced hypertension Continue monitoring BP. Normal growth on 8/1 Patient is interested in IOL at 39 weeks Discussed with patient that IOL may be scheduled if favorable cervix  Preterm labor symptoms and general obstetric precautions including but not limited to vaginal bleeding, contractions, leaking of fluid and fetal movement were reviewed in detail with the patient. Please refer to After Visit Summary for other counseling recommendations.  Return in about 1 week (around 09/06/2017) for ROB.  Future Appointments  Date Time Provider Department Center  09/02/2017 11:15 AM [facility] US 4 [facility]  09/06/2017  9:00 AM Anyanwu, Jethro BastosUgonna A, MD [facility] None  09/09/2017 10:45 AM [facility] US 2 [facility]  09/13/2017  9:45 AM Lilah Mijangos, Gigi GinPeggy, MD [facility] None  09/16/2017 10:15 AM [facility] US 4 [facility]  09/20/2017  8:30 AM Conan Bowensavis, Kelly M, MD [facility] None  09/23/2017 10:15 AM [facility] US 4 [facility]    Catalina AntiguaPeggy Brek Reece, MD

## 2017-08-31 LAB — CERVICOVAGINAL ANCILLARY ONLY
Chlamydia: NEGATIVE
NEISSERIA GONORRHEA: NEGATIVE

## 2017-09-02 ENCOUNTER — Ambulatory Visit (HOSPITAL_COMMUNITY)
Admission: RE | Admit: 2017-09-02 | Discharge: 2017-09-02 | Disposition: A | Discharge status: Home / Self Care | Payer: BC Managed Care – PPO | Source: Ambulatory Visit | Attending: Obstetrics | Admitting: Obstetrics

## 2017-09-02 ENCOUNTER — Other Ambulatory Visit: Payer: Self-pay | Admitting: Obstetrics

## 2017-09-02 DIAGNOSIS — Z3A36 36 weeks gestation of pregnancy: Secondary | ICD-10-CM

## 2017-09-02 DIAGNOSIS — O99013 Anemia complicating pregnancy, third trimester: Secondary | ICD-10-CM | POA: Diagnosis not present

## 2017-09-02 DIAGNOSIS — D573 Sickle-cell trait: Secondary | ICD-10-CM | POA: Insufficient documentation

## 2017-09-02 DIAGNOSIS — O99213 Obesity complicating pregnancy, third trimester: Secondary | ICD-10-CM

## 2017-09-02 DIAGNOSIS — O09293 Supervision of pregnancy with other poor reproductive or obstetric history, third trimester: Secondary | ICD-10-CM | POA: Diagnosis not present

## 2017-09-02 DIAGNOSIS — Z362 Encounter for other antenatal screening follow-up: Secondary | ICD-10-CM | POA: Insufficient documentation

## 2017-09-02 DIAGNOSIS — O09529 Supervision of elderly multigravida, unspecified trimester: Secondary | ICD-10-CM

## 2017-09-02 DIAGNOSIS — O09523 Supervision of elderly multigravida, third trimester: Secondary | ICD-10-CM | POA: Insufficient documentation

## 2017-09-02 DIAGNOSIS — E669 Obesity, unspecified: Secondary | ICD-10-CM | POA: Diagnosis not present

## 2017-09-03 LAB — CULTURE, BETA STREP (GROUP B ONLY): Strep Gp B Culture: NEGATIVE

## 2017-09-06 ENCOUNTER — Ambulatory Visit (INDEPENDENT_AMBULATORY_CARE_PROVIDER_SITE_OTHER): Payer: BC Managed Care – PPO | Admitting: Obstetrics & Gynecology

## 2017-09-06 ENCOUNTER — Encounter (HOSPITAL_COMMUNITY): Payer: Self-pay | Admitting: *Deleted

## 2017-09-06 ENCOUNTER — Telehealth (HOSPITAL_COMMUNITY): Payer: Self-pay | Admitting: *Deleted

## 2017-09-06 VITALS — BP 118/79 | HR 101 | Wt 254.9 lb

## 2017-09-06 DIAGNOSIS — Z3480 Encounter for supervision of other normal pregnancy, unspecified trimester: Secondary | ICD-10-CM

## 2017-09-06 DIAGNOSIS — O09523 Supervision of elderly multigravida, third trimester: Secondary | ICD-10-CM | POA: Diagnosis not present

## 2017-09-06 NOTE — Patient Instructions (Signed)
Return to clinic for any scheduled appointments or obstetric concerns, or go to [facility] for evaluation  

## 2017-09-06 NOTE — Progress Notes (Signed)
PRENATAL VISIT NOTE  Subjective:  Robin Duncan is a 37 y.o. Z6X0960 at [redacted]w[redacted]d being seen today for ongoing prenatal care.  She is currently monitored for the following issues for this high-risk pregnancy and has Supervision of normal pregnancy, antepartum; Advanced maternal age in multigravida, second trimester; History of pregnancy induced hypertension; Sickle cell trait (HCC); Vitamin D deficiency; and Hereditary disease in family possibly affecting fetus, affecting management of mother, antepartum condition or complication, not applicable or unspecified fetus on their problem list.  Patient reports no complaints.  Contractions: Irregular. Vag. Bleeding: None.  Movement: Present. Denies leaking of fluid.   The following portions of the patient's history were reviewed and updated as appropriate: allergies, current medications, past family history, past medical history, past social history, past surgical history and problem list. Problem list updated.  Objective:   Vitals:   09/06/17 0918  BP: 118/79  Pulse: (!) 101  Weight: 254 lb 14.4 oz (115.6 kg)    Fetal Status:     Movement: Present     General:  Alert, oriented and cooperative. Patient is in no acute distress.  Skin: Skin is warm and dry. No rash noted.   Cardiovascular: Normal heart rate noted  Respiratory: Normal respiratory effort, no problems with respiration noted  Abdomen: Soft, gravid, appropriate for gestational age.  Pain/Pressure: Present     Pelvic: Cervical exam deferred        Extremities: Normal range of motion.  Edema: Trace  Mental Status: Normal mood and affect. Normal behavior. Normal judgment and thought content.  [place] Mfm Fetal Bpp Wo Non Stress  Result Date: 09/02/2017 ----------------------------------------------------------------------  OBSTETRICS REPORT                      (Signed Final 09/02/2017 11:40 am) ---------------------------------------------------------------------- Patient Info  ID #:        [id number]                          D.O.B.:  04/09/1980 (36 yrs)  Name:       Robin Duncan              Visit Date: 09/02/2017 10:56 am ---------------------------------------------------------------------- Performed By  Performed By:     Eden Lathe BS      Ref. Address:     [address]                    [address]                                                             [address]                                                             [place]  [address]  Attending:        Noralee Space MD        Location:         [facility]  Referred By:      Roe Coombs CNM ---------------------------------------------------------------------- Orders   #  Description                                 Code   1  [place] MFM FETAL BPP WO NON STRESS              76819.01  ----------------------------------------------------------------------   #  Ordered By               Order #        Accession #    Episode #   1  Coral Ceo           [id number]      [id number]     [id number]  ---------------------------------------------------------------------- Indications   [redacted] weeks gestation of pregnancy                Z3A.36   Poor obstetric history: Previous               O09.299   preeclampsia / eclampsia/gestational HTN   History of sickle cell trait                   Z86.2   Encounter for other antenatal screening        Z36.2   follow-up   Obesity complicating pregnancy, third          O99.213   trimester (Pre Preg BMI 36)   Advanced maternal age multigravida 41+,        O87.523   third trimester (Low Risk NIPS)  ---------------------------------------------------------------------- OB History  Blood Type:            Height:  5'4"   Weight (lb):  236       BMI:  40.5  Gravidity:    4         Term:   2         SAB:   1  Living:       2 ---------------------------------------------------------------------- Fetal Evaluation  Num Of  Fetuses:     1  Fetal Heart         154  Rate(bpm):  Cardiac Activity:   Observed  Presentation:       Cephalic  Amniotic Fluid  AFI FV:      Subjectively within normal limits  AFI Sum(cm)     %Tile       Largest Pocket(cm)  9.87            22          3.46  RUQ(cm)                     LUQ(cm)        LLQ(cm)  3                           3.46           3.41 ---------------------------------------------------------------------- Biophysical Evaluation  Amniotic F.V:   Within normal limits       F. Tone:  Observed  F. Movement:    Observed                   Duncan:          8/8  F. Breathing:   Observed ---------------------------------------------------------------------- Gestational Age  LMP:           40w 6d        Date:  11/20/16                 EDD:   08/27/17  Best:          36w 5d     Det. By:  Marcella Dubs         EDD:   09/25/17                                      (02/25/17) ---------------------------------------------------------------------- Impression  Amniotic fluid is normal and good fetal activity is seen.  Antenatal testing is reassuring. BPP 8/8. ---------------------------------------------------------------------- Recommendations  Continue weekly antenatal testing till delivery. ----------------------------------------------------------------------                  Noralee Space, MD Electronically Signed Final Report   09/02/2017 11:40 am ----------------------------------------------------------------------  [place] Mfm Fetal Bpp Wo Non Stress  Result Date: 08/26/2017 ----------------------------------------------------------------------  OBSTETRICS REPORT                      (Signed Final 08/26/2017 02:01 pm) ---------------------------------------------------------------------- Patient Info  ID #:       [id number]                          D.O.B.:  [birth year] (36 yrs)  Name:       Robin Duncan              Visit Date: 08/26/2017 01:05 pm  ---------------------------------------------------------------------- Performed By  Performed By:     Tomma Lightning             Ref. Address:     [address]                    [address]                                                             [address]                                                             [place]                                                             [address]  Attending:        Noralee Space MD        [facility]  Referred By:      Roe Coombs CNM ---------------------------------------------------------------------- Orders   #  Description                                 Code   1  [place] MFM OB FOLLOW UP                         [id number]   2  [place] MFM FETAL BPP WO NON STRESS              76819.01  ----------------------------------------------------------------------   #  Ordered By               Order #        Accession #    Episode #   1  Coral Ceo           [id number]      [id number]     [id number]   2  Coral Ceo           [id number]      [id number]     [id number]  ---------------------------------------------------------------------- Indications   [redacted] weeks gestation of pregnancy                Z3A.35   Poor obstetric history: Previous               O09.299   preeclampsia / eclampsia/gestational HTN   History of sickle cell trait                   Z86.2   Encounter for other antenatal screening        Z36.2   follow-up   Obesity complicating pregnancy, third          O99.213   trimester (Pre Preg BMI 36)   Advanced maternal age multigravida 73+,        O21.523   third trimester (Low Risk NIPS)  ---------------------------------------------------------------------- OB History  Blood Type:            Height:  5'4"   Weight (lb):  236       BMI:  40.5  Gravidity:    4         Term:   2         SAB:   1  Living:       2 ---------------------------------------------------------------------- Fetal Evaluation  Num Of Fetuses:     1   Fetal Heart         146  Rate(bpm):  Cardiac Activity:   Observed  Presentation:       Cephalic  Placenta:           Anterior  P. Cord Insertion:  Previously Visualized  Amniotic Fluid  AFI FV:      Subjectively within normal limits  AFI Sum(cm)     %Tile       Largest Pocket(cm)  7.54            5           3.91  RUQ(cm)       RLQ(cm)       LUQ(cm)        LLQ(cm)  0.82          1.43  3.91           1.38 ---------------------------------------------------------------------- Biophysical Evaluation  Amniotic F.V:   Pocket => 2 cm two         F. Tone:        Observed                  planes  F. Movement:    Observed                   Duncan:          8/8  F. Breathing:   Observed ---------------------------------------------------------------------- Biometry  BPD:      88.6  mm     G. Age:  35w 6d         60  %    CI:        77.19   %    70 - 86                                                          FL/HC:      20.5   %    20.1 - 22.1  HC:      319.3  mm     G. Age:  36w 0d         25  %    HC/AC:      0.95        0.93 - 1.11  AC:      337.7  mm     G. Age:  37w 5d         95  %    FL/BPD:     74.0   %    71 - 87  FL:       65.6  mm     G. Age:  33w 6d          8  %    FL/AC:      19.4   %    20 - 24  HUM:        59  mm     G. Age:  34w 1d         36  %  Est. FW:    2915  gm      6 lb 7 oz     76  % ---------------------------------------------------------------------- Gestational Age  LMP:           39w 6d        Date:  11/20/16                 EDD:   08/27/17  U/S Today:     35w 6d                                        EDD:   09/24/17  Best:          35w 5d     Det. ByMarcella Dubs         EDD:   09/25/17                                      (  02/25/17) ---------------------------------------------------------------------- Anatomy  Cranium:               Appears normal         LVOT:                   Previously seen  Cavum:                 Previously seen        Aortic Arch:            Previously seen   Ventricles:            Previously seen        Ductal Arch:            Previously seen  Choroid Plexus:        Previously seen        Diaphragm:              Appears normal  Cerebellum:            Previously seen        Stomach:                Appears normal, left                                                                        sided  Posterior Fossa:       Previously seen        Abdomen:                Previously seen  Nuchal Fold:           Previously seen        Abdominal Wall:         Previously seen  Face:                  Orbits and profile     Cord Vessels:           Previously seen                         previously seen  Lips:                  Previously seen        Kidneys:                Appear normal  Palate:                Not well visualized    Bladder:                Appears normal  Thoracic:              Appears normal         Spine:                  Previously seen  Heart:                 Appears normal         Upper Extremities:      Previously seen                         (  4CH, axis, and situs  RVOT:                  Previously seen        Lower Extremities:      Previously seen  Other:  Heels and 5th digit previously seen. Open hands & Nasal bone prev          visualized. Technically difficult due to mat habitus and fetal position. ---------------------------------------------------------------------- Cervix Uterus Adnexa  Cervix  Not visualized (advanced GA >24wks)  Uterus  No abnormality visualized.  Left Ovary  Size(cm)       4.7  x   1.8    x  3.1       Vol(ml): 13.7  Within normal limits.  Right Ovary  Size(cm)        4   x   3.3    x  3         Vol(ml): 20.7  Simple cyst, measuring 3.3x2.7x2.2cm ---------------------------------------------------------------------- Impression  AMA.  Fetal growth is appropriate for gestational age. Amniotic fluid  is normal and good fetal activity is seen. Incidentally, a small  simple was seen in the right ovary, which is not associated  with any  adverse outcomes.  BPP 8/8. ---------------------------------------------------------------------- Recommendations  Patient has appointments for weekly antenatal testing. ----------------------------------------------------------------------                  Noralee Space, MD Electronically Signed Final Report   08/26/2017 02:01 pm ----------------------------------------------------------------------  [place] Mfm Ob Follow Up  Result Date: 08/26/2017 ----------------------------------------------------------------------  OBSTETRICS REPORT                      (Signed Final 08/26/2017 02:01 pm) ---------------------------------------------------------------------- Patient Info  ID #:       [id number]                          D.O.B.:  1980-12-13 (36 yrs)  Name:       Robin Duncan              Visit Date: 08/26/2017 01:05 pm ---------------------------------------------------------------------- Performed By  Performed By:     Tomma Lightning             Ref. Address:     [address]                    [address]                                                             [address]                                                             [place]  [address]  Attending:        Noralee Space MD        Location:         [facility]  Referred By:      Roe Coombs CNM ---------------------------------------------------------------------- Orders   #  Description                                 Code   1  [place] MFM OB FOLLOW UP                         [id number]   2  [place] MFM FETAL BPP WO NON STRESS              76819.01  ----------------------------------------------------------------------   #  Ordered By               Order #        Accession #    Episode #   1  Coral Ceo           [id number]      [id number]     [id number]   2  Coral Ceo           [id number]      [id number]     [id number]   ---------------------------------------------------------------------- Indications   [redacted] weeks gestation of pregnancy                Z3A.35   Poor obstetric history: Previous               O09.299   preeclampsia / eclampsia/gestational HTN   History of sickle cell trait                   Z86.2   Encounter for other antenatal screening        Z36.2   follow-up   Obesity complicating pregnancy, third          O99.213   trimester (Pre Preg BMI 36)   Advanced maternal age multigravida 53+,        O63.523   third trimester (Low Risk NIPS)  ---------------------------------------------------------------------- OB History  Blood Type:            Height:  5'4"   Weight (lb):  236       BMI:  40.5  Gravidity:    4         Term:   2         SAB:   1  Living:       2 ---------------------------------------------------------------------- Fetal Evaluation  Num Of Fetuses:     1  Fetal Heart         146  Rate(bpm):  Cardiac Activity:   Observed  Presentation:       Cephalic  Placenta:           Anterior  P. Cord Insertion:  Previously Visualized  Amniotic Fluid  AFI FV:      Subjectively within normal limits  AFI Sum(cm)     %Tile       Largest Pocket(cm)  7.54            5           3.91  RUQ(cm)       RLQ(cm)  LUQ(cm)        LLQ(cm)  0.82          1.43          3.91           1.38 ---------------------------------------------------------------------- Biophysical Evaluation  Amniotic F.V:   Pocket => 2 cm two         F. Tone:        Observed                  planes  F. Movement:    Observed                   Duncan:          8/8  F. Breathing:   Observed ---------------------------------------------------------------------- Biometry  BPD:      88.6  mm     G. Age:  35w 6d         60  %    CI:        77.19   %    70 - 86                                                          FL/HC:      20.5   %    20.1 - 22.1  HC:      319.3  mm     G. Age:  36w 0d         25  %    HC/AC:      0.95        0.93 - 1.11  AC:      337.7  mm      G. Age:  37w 5d         95  %    FL/BPD:     74.0   %    71 - 87  FL:       65.6  mm     G. Age:  33w 6d          8  %    FL/AC:      19.4   %    20 - 24  HUM:        59  mm     G. Age:  34w 1d         36  %  Est. FW:    2915  gm      6 lb 7 oz     76  % ---------------------------------------------------------------------- Gestational Age  LMP:           39w 6d        Date:  11/20/16                 EDD:   08/27/17  U/S Today:     35w 6d                                        EDD:   09/24/17  Best:          35w 5d     Det. ByMarcella Dubs         EDD:   09/25/17                                      (  02/25/17) ---------------------------------------------------------------------- Anatomy  Cranium:               Appears normal         LVOT:                   Previously seen  Cavum:                 Previously seen        Aortic Arch:            Previously seen  Ventricles:            Previously seen        Ductal Arch:            Previously seen  Choroid Plexus:        Previously seen        Diaphragm:              Appears normal  Cerebellum:            Previously seen        Stomach:                Appears normal, left                                                                        sided  Posterior Fossa:       Previously seen        Abdomen:                Previously seen  Nuchal Fold:           Previously seen        Abdominal Wall:         Previously seen  Face:                  Orbits and profile     Cord Vessels:           Previously seen                         previously seen  Lips:                  Previously seen        Kidneys:                Appear normal  Palate:                Not well visualized    Bladder:                Appears normal  Thoracic:              Appears normal         Spine:                  Previously seen  Heart:                 Appears normal         Upper Extremities:      Previously seen                         (  4CH, axis, and situs  RVOT:                  Previously  seen        Lower Extremities:      Previously seen  Other:  Heels and 5th digit previously seen. Open hands & Nasal bone prev          visualized. Technically difficult due to mat habitus and fetal position. ---------------------------------------------------------------------- Cervix Uterus Adnexa  Cervix  Not visualized (advanced GA >24wks)  Uterus  No abnormality visualized.  Left Ovary  Size(cm)       4.7  x   1.8    x  3.1       Vol(ml): 13.7  Within normal limits.  Right Ovary  Size(cm)        4   x   3.3    x  3         Vol(ml): 20.7  Simple cyst, measuring 3.3x2.7x2.2cm ---------------------------------------------------------------------- Impression  AMA.  Fetal growth is appropriate for gestational age. Amniotic fluid  is normal and good fetal activity is seen. Incidentally, a small  simple was seen in the right ovary, which is not associated  with any adverse outcomes.  BPP 8/8. ---------------------------------------------------------------------- Recommendations  Patient has appointments for weekly antenatal testing. ----------------------------------------------------------------------                  Noralee Spaceavi Shankar, MD Electronically Signed Final Report   08/26/2017 02:01 pm ----------------------------------------------------------------------  Results for orders placed or performed in visit on 08/30/17 (from the past 336 hour(s))  Cervicovaginal ancillary only   Collection Time: 08/30/17 12:00 AM  Result Value Ref Range   Chlamydia Negative    Neisseria gonorrhea Negative   Culture, beta strep (group b only)   Collection Time: 08/30/17  9:15 AM  Result Value Ref Range   Strep Gp B Culture Negative Negative    Assessment and Plan:  Pregnancy: Z6X0960G4P2012 at 5946w2d  1. AMA (advanced maternal age) multigravida 35+, third trimester - Fetal nonstress test done.  NST performed today was reviewed and was found to be reactive. Continue recommended antenatal testing and prenatal care. IOL  scheduled at 39 weeks as per patient's request, orders placed  2. Supervision of other normal pregnancy, antepartum Informed patient of negative pelvic cultures Term labor symptoms and general obstetric precautions including but not limited to vaginal bleeding, contractions, leaking of fluid and fetal movement were reviewed in detail with the patient. Please refer to After Visit Summary for other counseling recommendations.  Return for OB visits and antenatal testing as scheduled.  Future Appointments  Date Time Provider Department Center  09/09/2017 10:45 AM [facility] US 2 [facility]  09/13/2017  9:45 AM Constant, Gigi GinPeggy, MD [facility] None  09/16/2017 10:15 AM [facility] US 4 [facility]  09/20/2017  8:30 AM Conan Bowensavis, Kelly M, MD [facility] None  09/23/2017 10:15 AM [facility] US 4 [facility]    Jaynie CollinsUgonna Aaryan Essman, MD

## 2017-09-06 NOTE — Telephone Encounter (Signed)
Preadmission screen  

## 2017-09-09 ENCOUNTER — Ambulatory Visit (HOSPITAL_COMMUNITY)
Admission: RE | Admit: 2017-09-09 | Discharge: 2017-09-09 | Disposition: A | Discharge status: Home / Self Care | Payer: BC Managed Care – PPO | Source: Ambulatory Visit | Attending: Obstetrics | Admitting: Obstetrics

## 2017-09-09 DIAGNOSIS — O09293 Supervision of pregnancy with other poor reproductive or obstetric history, third trimester: Secondary | ICD-10-CM | POA: Diagnosis not present

## 2017-09-09 DIAGNOSIS — O09523 Supervision of elderly multigravida, third trimester: Secondary | ICD-10-CM | POA: Diagnosis not present

## 2017-09-09 DIAGNOSIS — Z3A37 37 weeks gestation of pregnancy: Secondary | ICD-10-CM

## 2017-09-09 DIAGNOSIS — O99213 Obesity complicating pregnancy, third trimester: Secondary | ICD-10-CM | POA: Diagnosis not present

## 2017-09-09 DIAGNOSIS — O09529 Supervision of elderly multigravida, unspecified trimester: Secondary | ICD-10-CM | POA: Diagnosis present

## 2017-09-13 ENCOUNTER — Encounter: Payer: Self-pay | Admitting: Obstetrics and Gynecology

## 2017-09-13 ENCOUNTER — Other Ambulatory Visit: Payer: Self-pay

## 2017-09-13 ENCOUNTER — Ambulatory Visit (INDEPENDENT_AMBULATORY_CARE_PROVIDER_SITE_OTHER): Payer: BC Managed Care – PPO | Admitting: Obstetrics and Gynecology

## 2017-09-13 VITALS — BP 126/80 | HR 99 | Wt 255.0 lb

## 2017-09-13 DIAGNOSIS — O09522 Supervision of elderly multigravida, second trimester: Secondary | ICD-10-CM

## 2017-09-13 DIAGNOSIS — Z3480 Encounter for supervision of other normal pregnancy, unspecified trimester: Secondary | ICD-10-CM

## 2017-09-13 NOTE — Progress Notes (Signed)
   PRENATAL VISIT NOTE  Subjective:  Robin Duncan is a 37 y.o. Z6X0960G4P2012 at 3732w2d being seen today for ongoing prenatal care.  She is currently monitored for the following issues for this high-risk pregnancy and has Supervision of normal pregnancy, antepartum; Advanced maternal age in multigravida, second trimester; History of pregnancy induced hypertension; Sickle cell trait (HCC); Vitamin D deficiency; and Hereditary disease in family possibly affecting fetus, affecting management of mother, antepartum condition or complication, not applicable or unspecified fetus on their problem list.  Patient reports no complaints.  Contractions: Irregular. Vag. Bleeding: Other.  Movement: Present. Denies leaking of fluid.   The following portions of the patient's history were reviewed and updated as appropriate: allergies, current medications, past family history, past medical history, past social history, past surgical history and problem list. Problem list updated.  Objective:   Vitals:   09/13/17 0944  BP: 126/80  Pulse: 99  Weight: 255 lb (115.7 kg)    Fetal Status:     Movement: Present     General:  Alert, oriented and cooperative. Patient is in no acute distress.  Skin: Skin is warm and dry. No rash noted.   Cardiovascular: Normal heart rate noted  Respiratory: Normal respiratory effort, no problems with respiration noted  Abdomen: Soft, gravid, appropriate for gestational age.  Pain/Pressure: Present     Pelvic: Cervical exam deferred        Extremities: Normal range of motion.  Edema: Trace  Mental Status: Normal mood and affect. Normal behavior. Normal judgment and thought content.   Assessment and Plan:  Pregnancy: A5W0981G4P2012 at 432w2d  1. Supervision of other normal pregnancy, antepartum Patient is doing well without complaints  2. Advanced maternal age in multigravida, second trimester NST reviewed and reactive BPP on 7/22 IOL scheduled on 7/24  Term labor symptoms and  general obstetric precautions including but not limited to vaginal bleeding, contractions, leaking of fluid and fetal movement were reviewed in detail with the patient. Please refer to After Visit Summary for other counseling recommendations.  No follow-ups on file.  Future Appointments  Date Time Provider Department Center  09/16/2017 10:15 AM [facility] US 4 [facility]  09/18/2017  7:30 AM [facility] ROOM [facility] None    Catalina AntiguaPeggy Mareesa Gathright, MD

## 2017-09-14 ENCOUNTER — Inpatient Hospital Stay (EMERGENCY_DEPARTMENT_HOSPITAL)
Admission: AD | Admit: 2017-09-14 | Discharge: 2017-09-14 | Disposition: A | Discharge status: Home / Self Care | Payer: BC Managed Care – PPO | Source: Ambulatory Visit | Attending: Obstetrics & Gynecology | Admitting: Obstetrics & Gynecology

## 2017-09-14 ENCOUNTER — Other Ambulatory Visit: Payer: Self-pay

## 2017-09-14 ENCOUNTER — Encounter (HOSPITAL_COMMUNITY): Payer: Self-pay

## 2017-09-14 DIAGNOSIS — O479 False labor, unspecified: Secondary | ICD-10-CM

## 2017-09-14 DIAGNOSIS — O163 Unspecified maternal hypertension, third trimester: Secondary | ICD-10-CM

## 2017-09-14 LAB — COMPREHENSIVE METABOLIC PANEL
ALK PHOS: 151 U/L — AB (ref 38–126)
ALT: 15 U/L (ref 0–44)
ANION GAP: 12 (ref 5–15)
AST: 19 U/L (ref 15–41)
Albumin: 3.2 g/dL — ABNORMAL LOW (ref 3.5–5.0)
BILIRUBIN TOTAL: 0.4 mg/dL (ref 0.3–1.2)
BUN: 5 mg/dL — ABNORMAL LOW (ref 6–20)
CALCIUM: 9 mg/dL (ref 8.9–10.3)
CO2: 19 mmol/L — AB (ref 22–32)
CREATININE: 0.62 mg/dL (ref 0.44–1.00)
Chloride: 104 mmol/L (ref 98–111)
GFR calc non Af Amer: >60 mL/min (ref >60)
Glucose, Bld: 95 mg/dL (ref 70–99)
Potassium: 3.5 mmol/L (ref 3.5–5.1)
SODIUM: 135 mmol/L (ref 135–145)
TOTAL PROTEIN: 7.2 g/dL (ref 6.5–8.1)

## 2017-09-14 LAB — URINALYSIS, ROUTINE W REFLEX MICROSCOPIC
Bilirubin Urine: NEGATIVE
Glucose, UA: NEGATIVE mg/dL
HGB URINE DIPSTICK: NEGATIVE
Ketones, ur: 5 mg/dL — AB
NITRITE: NEGATIVE
PH: 6 (ref 5.0–8.0)
Protein, ur: NEGATIVE mg/dL
Specific Gravity, Urine: 1.011 (ref 1.005–1.030)

## 2017-09-14 LAB — CBC
HEMATOCRIT: 34.7 % — AB (ref 36.0–46.0)
HEMOGLOBIN: 11.8 g/dL — AB (ref 12.0–15.0)
MCH: 26.9 pg (ref 26.0–34.0)
MCHC: 34 g/dL (ref 30.0–36.0)
MCV: 79.2 fL (ref 78.0–100.0)
Platelets: 168 10*3/uL (ref 150–400)
RBC: 4.38 MIL/uL (ref 3.87–5.11)
RDW: 15 % (ref 11.5–15.5)
WBC: 8.1 10*3/uL (ref 4.0–10.5)

## 2017-09-14 LAB — PROTEIN / CREATININE RATIO, URINE
CREATININE, URINE: 94 mg/dL
Protein Creatinine Ratio: 0.12 mg/mg{Cre} (ref 0.00–0.15)
TOTAL PROTEIN, URINE: 11 mg/dL

## 2017-09-14 NOTE — MAU Note (Signed)
U/A in lab, not enough urine for culture tube

## 2017-09-14 NOTE — Discharge Instructions (Signed)
Braxton Hicks Contractions °Contractions of the uterus can occur throughout pregnancy, but they are not always a sign that you are in labor. You may have practice contractions called Braxton Hicks contractions. These false labor contractions are sometimes confused with true labor. °What are Braxton Hicks contractions? °Braxton Hicks contractions are tightening movements that occur in the muscles of the uterus before labor. Unlike true labor contractions, these contractions do not result in opening (dilation) and thinning of the cervix. Toward the end of pregnancy (32-34 weeks), Braxton Hicks contractions can happen more often and may become stronger. These contractions are sometimes difficult to tell apart from true labor because they can be very uncomfortable. You should not feel embarrassed if you go to the hospital with false labor. °Sometimes, the only way to tell if you are in true labor is for your health care provider to look for changes in the cervix. The health care provider will do a physical exam and may monitor your contractions. If you are not in true labor, the exam should show that your cervix is not dilating and your water has not broken. °If there are other health problems associated with your pregnancy, it is completely safe for you to be sent home with false labor. You may continue to have Braxton Hicks contractions until you go into true labor. °How to tell the difference between true labor and false labor °True labor °· Contractions last 30-70 seconds. °· Contractions become very regular. °· Discomfort is usually felt in the top of the uterus, and it spreads to the lower abdomen and low back. °· Contractions do not go away with walking. °· Contractions usually become more intense and increase in frequency. °· The cervix dilates and gets thinner. °False labor °· Contractions are usually shorter and not as strong as true labor contractions. °· Contractions are usually irregular. °· Contractions  are often felt in the front of the lower abdomen and in the groin. °· Contractions may go away when you walk around or change positions while lying down. °· Contractions get weaker and are shorter-lasting as time goes on. °· The cervix usually does not dilate or become thin. °Follow these instructions at home: °· Take over-the-counter and prescription medicines only as told by your health care provider. °· Keep up with your usual exercises and follow other instructions from your health care provider. °· Eat and drink lightly if you think you are going into labor. °· If Braxton Hicks contractions are making you uncomfortable: °? Change your position from lying down or resting to walking, or change from walking to resting. °? Sit and rest in a tub of warm water. °? Drink enough fluid to keep your urine pale yellow. Dehydration may cause these contractions. °? Do slow and deep breathing several times an hour. °· Keep all follow-up prenatal visits as told by your health care provider. This is important. °Contact a health care provider if: °· You have a fever. °· You have continuous pain in your abdomen. °Get help right away if: °· Your contractions become stronger, more regular, and closer together. °· You have fluid leaking or gushing from your vagina. °· You pass blood-tinged mucus (bloody show). °· You have bleeding from your vagina. °· You have low back pain that you never had before. °· You feel your baby’s head pushing down and causing pelvic pressure. °· Your baby is not moving inside you as much as it used to. °Summary °· Contractions that occur before labor are called Braxton   Hicks contractions, false labor, or practice contractions. °· Braxton Hicks contractions are usually shorter, weaker, farther apart, and less regular than true labor contractions. True labor contractions usually become progressively stronger and regular and they become more frequent. °· Manage discomfort from Braxton Hicks contractions by  changing position, resting in a warm bath, drinking plenty of water, or practicing deep breathing. °This information is not intended to replace advice given to you by your health care provider. Make sure you discuss any questions you have with your health care provider. °Document Released: 05/28/2016 Document Revised: 05/28/2016 Document Reviewed: 05/28/2016 °Elsevier Interactive Patient Education © 2018 [employer] ° °Fetal Movement Counts °Patient Name: ________________________________________________ Patient Due Date: ____________________ °What is a fetal movement count? °A fetal movement count is the number of times that you feel your baby move during a certain amount of time. This may also be called a fetal kick count. A fetal movement count is recommended for every pregnant woman. You may be asked to start counting fetal movements as early as week 28 of your pregnancy. °Pay attention to when your baby is most active. You may notice your baby's sleep and wake cycles. You may also notice things that make your baby move more. You should do a fetal movement count: °· When your baby is normally most active. °· At the same time each day. ° °A good time to count movements is while you are resting, after having something to eat and drink. °How do I count fetal movements? °1. Find a quiet, comfortable area. Sit, or lie down on your side. °2. Write down the date, the start time and stop time, and the number of movements that you felt between those two times. Take this information with you to your health care visits. °3. For 2 hours, count kicks, flutters, swishes, rolls, and jabs. You should feel at least 10 movements during 2 hours. °4. You may stop counting after you have felt 10 movements. °5. If you do not feel 10 movements in 2 hours, have something to eat and drink. Then, keep resting and counting for 1 hour. If you feel at least 4 movements during that hour, you may stop counting. °Contact a health care  provider if: °· You feel fewer than 4 movements in 2 hours. °· Your baby is not moving like he or she usually does. °Date: ____________ Start time: ____________ Stop time: ____________ Movements: ____________ °Date: ____________ Start time: ____________ Stop time: ____________ Movements: ____________ °Date: ____________ Start time: ____________ Stop time: ____________ Movements: ____________ °Date: ____________ Start time: ____________ Stop time: ____________ Movements: ____________ °Date: ____________ Start time: ____________ Stop time: ____________ Movements: ____________ °Date: ____________ Start time: ____________ Stop time: ____________ Movements: ____________ °Date: ____________ Start time: ____________ Stop time: ____________ Movements: ____________ °Date: ____________ Start time: ____________ Stop time: ____________ Movements: ____________ °Date: ____________ Start time: ____________ Stop time: ____________ Movements: ____________ °This information is not intended to replace advice given to you by your health care provider. Make sure you discuss any questions you have with your health care provider. °Document Released: 02/11/2006 Document Revised: 09/11/2015 Document Reviewed: 02/21/2015 °Elsevier Interactive Patient Education © 2018 Elsevier Inc[employer] ° °

## 2017-09-14 NOTE — MAU Provider Note (Addendum)
Chief Complaint  Patient presents with  . Contractions     First Provider Initiated Contact with Patient 09/14/17 0908      S: Robin Duncan  is a 37 y.o. y.o. year old 834P2012 female at 6228w3d weeks gestation who presents to MAU with contractions. No cervical change, but was incidentally found to have elevated blood pressures. Pos Hx HTN GHTN w/ prior pregnancy and isolated elevated BP at 12 weeks in this pregnancy. Current blood pressure medication: None  Associated symptoms: Denies Headache, vision changes, epigastric pain Contractions: Irreg, moderate Vaginal bleeding: denies Fetal movement: Nml  O:  Patient Vitals for the past 24 hrs:  BP Temp Temp src Pulse Resp SpO2 Height Weight  09/14/17 0618 126/85 98 F (36.7 C) Oral 100 18 100 % - -  09/14/17 16100611 - - - - - - 5\' 2"  (1.575 m) 116.5 kg   General: NAD Heart: Regular rate Lungs: Normal rate and effort Abd: Soft, NT, Gravid, S=D Extremities: 1+ Pedal edema Neuro: 2+ deep tendon reflexes, No clonus Pelvic: NEFG, no bleeding or LOF.   Dilation: 3 Effacement (%): 80 Station: -3 Presentation: Vertex Exam by:: n druebbisch rn  EFM: 140, Moderate variability, 15 x 15 accelerations, no decelerations Toco: Irreg, mild-mod  Orders Placed This Encounter  Procedures  . OB RESULT CONSOLE Group B Strep  . CBC  . Comprehensive metabolic panel  . Urinalysis, Routine w reflex microscopic  . Protein / creatinine ratio, urine  . Contraction - monitoring  . External fetal heart monitoring  . Vaginal exam  . Vital signs   Care of pt turned over to Vonzella NippleJulie Armeda [place], [place] at [phone]. Labs pending.   Katrinka BlazingSmith, [place], CNM 09/14/2017 9:55 AM   MDM Results for orders placed or performed during the hospital encounter of 09/14/17 (from the past 24 hour(s))  Urinalysis, Routine w reflex microscopic     Status: Abnormal   Collection Time: 09/14/17  9:12 AM  Result Value Ref Range   Color, Urine YELLOW YELLOW   APPearance HAZY (A)  CLEAR   Specific Gravity, Urine 1.011 1.005 - 1.030   pH 6.0 5.0 - 8.0   Glucose, UA NEGATIVE NEGATIVE mg/dL   Hgb urine dipstick NEGATIVE NEGATIVE   Bilirubin Urine NEGATIVE NEGATIVE   Ketones, ur 5 (A) NEGATIVE mg/dL   Protein, ur NEGATIVE NEGATIVE mg/dL   Nitrite NEGATIVE NEGATIVE   Leukocytes, UA SMALL (A) NEGATIVE   RBC / HPF 0-5 0 - 5 RBC/hpf   WBC, UA 0-5 0 - 5 WBC/hpf   Bacteria, UA RARE (A) NONE SEEN   Squamous Epithelial / LPF 0-5 0 - 5   Mucus PRESENT   Protein / creatinine ratio, urine     Status: None   Collection Time: 09/14/17  9:12 AM  Result Value Ref Range   Creatinine, Urine 94.00 mg/dL   Total Protein, Urine 11 mg/dL   Protein Creatinine Ratio 0.12 0.00 - 0.15 mg/mg[Cre]  CBC     Status: Abnormal   Collection Time: 09/14/17  9:27 AM  Result Value Ref Range   WBC 8.1 4.0 - 10.5 K/uL   RBC 4.38 3.87 - 5.11 MIL/uL   Hemoglobin 11.8 (L) 12.0 - 15.0 g/dL   HCT 54.034.7 (L) 98.136.0 - 19.146.0 %   MCV 79.2 78.0 - 100.0 fL   MCH 26.9 26.0 - 34.0 pg   MCHC 34.0 30.0 - 36.0 g/dL   RDW 47.815.0 29.511.5 - 62.115.5 %   Platelets 168 150 - 400  K/uL  Comprehensive metabolic panel     Status: Abnormal   Collection Time: 09/14/17  9:27 AM  Result Value Ref Range   Sodium 135 135 - 145 mmol/L   Potassium 3.5 3.5 - 5.1 mmol/L   Chloride 104 98 - 111 mmol/L   CO2 19 (L) 22 - 32 mmol/L   Glucose, Bld 95 70 - 99 mg/dL   BUN 5 (L) 6 - 20 mg/dL   Creatinine, Ser 1.610.62 0.44 - 1.00 mg/dL   Calcium 9.0 8.9 - 09.610.3 mg/dL   Total Protein 7.2 6.5 - 8.1 g/dL   Albumin 3.2 (L) 3.5 - 5.0 g/dL   AST 19 15 - 41 U/L   ALT 15 0 - 44 U/L   Alkaline Phosphatase 151 (H) 38 - 126 U/L   Total Bilirubin 0.4 0.3 - 1.2 mg/dL   GFR calc non Af Amer >60 >60 mL/min   GFR calc Af Amer >60 >60 mL/min   Anion gap 12 5 - 15   Discussed patient with Dr. Vergie LivingPickens. He has reviewed the chart. Patient likely CHTN in pregnancy without superimposed pre-eclampsia today. Follow-up as scheduled for weekly BPP in the office.    A: SIUP at 821w3d CHTN in pregnancy, third trimester False labor  P:  Discharge home Pre-eclampsia/labor precautions discussed Patient advised to follow-up with [facility] as scheduled for routine care Patient may return to MAU as needed or if her condition were to change or worsen  Marny LowensteinWenzel, Donnica Jarnagin N, PA-C 09/14/2017 11:24 AM

## 2017-09-14 NOTE — MAU Note (Signed)
Pt states ctx started yesterday evenings and woke her up tonight. Reports the ctx are 15 min apart. States she sees bloody show but denies leaking. +FM

## 2017-09-15 ENCOUNTER — Encounter (HOSPITAL_COMMUNITY): Payer: Self-pay

## 2017-09-15 ENCOUNTER — Other Ambulatory Visit: Payer: Self-pay

## 2017-09-15 ENCOUNTER — Inpatient Hospital Stay (HOSPITAL_COMMUNITY): Payer: BC Managed Care – PPO | Admitting: Anesthesiology

## 2017-09-15 ENCOUNTER — Inpatient Hospital Stay (HOSPITAL_COMMUNITY)
Admission: AD | Admit: 2017-09-15 | Discharge: 2017-09-16 | DRG: 807 | Disposition: A | Discharge status: Home / Self Care | Payer: BC Managed Care – PPO | Attending: Obstetrics and Gynecology | Admitting: Obstetrics and Gynecology

## 2017-09-15 DIAGNOSIS — Z3A38 38 weeks gestation of pregnancy: Secondary | ICD-10-CM

## 2017-09-15 DIAGNOSIS — D573 Sickle-cell trait: Secondary | ICD-10-CM | POA: Diagnosis present

## 2017-09-15 DIAGNOSIS — O9902 Anemia complicating childbirth: Secondary | ICD-10-CM | POA: Diagnosis present

## 2017-09-15 DIAGNOSIS — Z3483 Encounter for supervision of other normal pregnancy, third trimester: Secondary | ICD-10-CM | POA: Diagnosis present

## 2017-09-15 DIAGNOSIS — Z87891 Personal history of nicotine dependence: Secondary | ICD-10-CM | POA: Diagnosis not present

## 2017-09-15 DIAGNOSIS — O99214 Obesity complicating childbirth: Secondary | ICD-10-CM | POA: Diagnosis present

## 2017-09-15 LAB — CBC
HCT: 34.3 % — ABNORMAL LOW (ref 36.0–46.0)
Hemoglobin: 11.8 g/dL — ABNORMAL LOW (ref 12.0–15.0)
MCH: 27.1 pg (ref 26.0–34.0)
MCHC: 34.4 g/dL (ref 30.0–36.0)
MCV: 78.7 fL (ref 78.0–100.0)
PLATELETS: 180 10*3/uL (ref 150–400)
RBC: 4.36 MIL/uL (ref 3.87–5.11)
RDW: 15.3 % (ref 11.5–15.5)
WBC: 7.7 10*3/uL (ref 4.0–10.5)

## 2017-09-15 LAB — TYPE AND SCREEN
ABO/RH(D): O POS
ANTIBODY SCREEN: NEGATIVE

## 2017-09-15 LAB — RPR: RPR: NONREACTIVE

## 2017-09-15 MED ORDER — ACETAMINOPHEN 325 MG PO TABS: 650.0000 mg | ORAL_TABLET | ORAL | Status: DC | PRN

## 2017-09-15 MED ORDER — ZOLPIDEM TARTRATE 5 MG PO TABS: 5.0000 mg | ORAL_TABLET | Freq: Every evening | ORAL | Status: DC | PRN

## 2017-09-15 MED ORDER — COCONUT OIL OIL: 1.0000 "application " | TOPICAL_OIL | Status: DC | PRN

## 2017-09-15 MED ORDER — SIMETHICONE 80 MG PO CHEW: 80.0000 mg | CHEWABLE_TABLET | ORAL | Status: DC | PRN

## 2017-09-15 MED ORDER — HYDROXYZINE HCL 50 MG PO TABS
50.0000 mg | ORAL_TABLET | Freq: Four times a day (QID) | ORAL | Status: DC | PRN
  Filled 2017-09-15: qty 1

## 2017-09-15 MED ORDER — OXYTOCIN BOLUS FROM INFUSION
500.0000 mL | Freq: Once | INTRAVENOUS | Status: AC
  Administered 2017-09-15: 500 mL via INTRAVENOUS

## 2017-09-15 MED ORDER — DIBUCAINE 1 % RE OINT: 1.0000 "application " | TOPICAL_OINTMENT | RECTAL | Status: DC | PRN

## 2017-09-15 MED ORDER — SENNOSIDES-DOCUSATE SODIUM 8.6-50 MG PO TABS
2.0000 | ORAL_TABLET | ORAL | Status: DC
  Administered 2017-09-15: 2 via ORAL
  Filled 2017-09-15: qty 2

## 2017-09-15 MED ORDER — DIPHENHYDRAMINE HCL 25 MG PO CAPS: 25.0000 mg | ORAL_CAPSULE | Freq: Four times a day (QID) | ORAL | Status: DC | PRN

## 2017-09-15 MED ORDER — PRENATAL MULTIVITAMIN CH
1.0000 | ORAL_TABLET | Freq: Every day | ORAL | Status: DC
  Administered 2017-09-15 – 2017-09-16 (×2): 1 via ORAL
  Filled 2017-09-15 (×2): qty 1

## 2017-09-15 MED ORDER — DIPHENHYDRAMINE HCL 50 MG/ML IJ SOLN: 12.5000 mg | INTRAMUSCULAR | Status: DC | PRN

## 2017-09-15 MED ORDER — MISOPROSTOL 25 MCG QUARTER TABLET
25.0000 ug | ORAL_TABLET | ORAL | Status: DC | PRN
  Filled 2017-09-15: qty 1

## 2017-09-15 MED ORDER — FENTANYL CITRATE (PF) 100 MCG/2ML IJ SOLN
100.0000 ug | INTRAMUSCULAR | Status: DC | PRN
  Administered 2017-09-15: 100 ug via INTRAVENOUS
  Filled 2017-09-15: qty 2

## 2017-09-15 MED ORDER — ONDANSETRON HCL 4 MG PO TABS: 4.0000 mg | ORAL_TABLET | ORAL | Status: DC | PRN

## 2017-09-15 MED ORDER — OXYCODONE-ACETAMINOPHEN 5-325 MG PO TABS: 2.0000 | ORAL_TABLET | ORAL | Status: DC | PRN

## 2017-09-15 MED ORDER — TERBUTALINE SULFATE 1 MG/ML IJ SOLN
0.2500 mg | Freq: Once | INTRAMUSCULAR | Status: DC | PRN
  Filled 2017-09-15: qty 1

## 2017-09-15 MED ORDER — PHENYLEPHRINE 40 MCG/ML (10ML) SYRINGE FOR IV PUSH (FOR BLOOD PRESSURE SUPPORT)
80.0000 ug | PREFILLED_SYRINGE | INTRAVENOUS | Status: DC | PRN
  Filled 2017-09-15: qty 5

## 2017-09-15 MED ORDER — LIDOCAINE HCL (PF) 1 % IJ SOLN
30.0000 mL | INTRAMUSCULAR | Status: DC | PRN
  Filled 2017-09-15: qty 30

## 2017-09-15 MED ORDER — BENZOCAINE-MENTHOL 20-0.5 % EX AERO: 1.0000 "application " | INHALATION_SPRAY | CUTANEOUS | Status: DC | PRN

## 2017-09-15 MED ORDER — EPHEDRINE 5 MG/ML INJ
10.0000 mg | INTRAVENOUS | Status: DC | PRN
  Filled 2017-09-15: qty 2

## 2017-09-15 MED ORDER — LACTATED RINGERS IV SOLN: 500.0000 mL | Freq: Once | INTRAVENOUS | Status: DC

## 2017-09-15 MED ORDER — OXYTOCIN 40 UNITS IN LACTATED RINGERS INFUSION - SIMPLE MED
2.5000 [IU]/h | INTRAVENOUS | Status: DC
  Administered 2017-09-15: 2.5 [IU]/h via INTRAVENOUS
  Filled 2017-09-15: qty 1000

## 2017-09-15 MED ORDER — OXYTOCIN 40 UNITS IN LACTATED RINGERS INFUSION - SIMPLE MED: 1.0000 m[IU]/min | INTRAVENOUS | Status: DC

## 2017-09-15 MED ORDER — LIDOCAINE HCL (PF) 1 % IJ SOLN
INTRAMUSCULAR | Status: DC | PRN
  Administered 2017-09-15: 13 mL via EPIDURAL

## 2017-09-15 MED ORDER — ONDANSETRON HCL 4 MG/2ML IJ SOLN: 4.0000 mg | Freq: Four times a day (QID) | INTRAMUSCULAR | Status: DC | PRN

## 2017-09-15 MED ORDER — FENTANYL 2.5 MCG/ML BUPIVACAINE 1/10 % EPIDURAL INFUSION (WH - ANES)
14.0000 mL/h | INTRAMUSCULAR | Status: DC | PRN
  Administered 2017-09-15: 14 mL/h via EPIDURAL
  Filled 2017-09-15: qty 100

## 2017-09-15 MED ORDER — LACTATED RINGERS IV SOLN
INTRAVENOUS | Status: DC
  Administered 2017-09-15: 03:00:00 via INTRAVENOUS

## 2017-09-15 MED ORDER — ONDANSETRON HCL 4 MG/2ML IJ SOLN: 4.0000 mg | INTRAMUSCULAR | Status: DC | PRN

## 2017-09-15 MED ORDER — PHENYLEPHRINE 40 MCG/ML (10ML) SYRINGE FOR IV PUSH (FOR BLOOD PRESSURE SUPPORT)
80.0000 ug | PREFILLED_SYRINGE | INTRAVENOUS | Status: DC | PRN
  Filled 2017-09-15: qty 5
  Filled 2017-09-15: qty 10

## 2017-09-15 MED ORDER — LACTATED RINGERS IV SOLN: 500.0000 mL | INTRAVENOUS | Status: DC | PRN

## 2017-09-15 MED ORDER — IBUPROFEN 600 MG PO TABS
600.0000 mg | ORAL_TABLET | Freq: Four times a day (QID) | ORAL | Status: DC
  Administered 2017-09-15 – 2017-09-16 (×6): 600 mg via ORAL
  Filled 2017-09-15 (×6): qty 1

## 2017-09-15 MED ORDER — OXYCODONE-ACETAMINOPHEN 5-325 MG PO TABS: 1.0000 | ORAL_TABLET | ORAL | Status: DC | PRN

## 2017-09-15 MED ORDER — SOD CITRATE-CITRIC ACID 500-334 MG/5ML PO SOLN: 30.0000 mL | ORAL | Status: DC | PRN

## 2017-09-15 MED ORDER — FENTANYL CITRATE (PF) 100 MCG/2ML IJ SOLN: 50.0000 ug | INTRAMUSCULAR | Status: DC | PRN

## 2017-09-15 MED ORDER — FLEET ENEMA 7-19 GM/118ML RE ENEM: 1.0000 | ENEMA | Freq: Every day | RECTAL | Status: DC | PRN

## 2017-09-15 MED ORDER — WITCH HAZEL-GLYCERIN EX PADS: 1.0000 "application " | MEDICATED_PAD | CUTANEOUS | Status: DC | PRN

## 2017-09-15 NOTE — MAU Note (Signed)
Pt states that her ctx's started at 0100.  Denies vaginal bleeding or LOF.   Pt reports that the baby is moving less than he was earlier tonight.

## 2017-09-15 NOTE — Anesthesia Preprocedure Evaluation (Signed)
Anesthesia Evaluation    Airway Mallampati: II  TM Distance: >3 FB Neck ROM: Full    Dental no notable dental hx.    Pulmonary former smoker,    Pulmonary exam normal breath sounds clear to auscultation       Cardiovascular hypertension, Normal cardiovascular exam Rhythm:Regular Rate:Normal     Neuro/Psych    GI/Hepatic   Endo/Other  Morbid obesity  Renal/GU      Musculoskeletal   Abdominal   Peds  Hematology   Anesthesia Other Findings   Reproductive/Obstetrics (+) Pregnancy                             Anesthesia Physical Anesthesia Plan  ASA: III  Anesthesia Plan: Epidural   Post-op Pain Management:    Induction:   PONV Risk Score and Plan:   Airway Management Planned:   Additional Equipment:   Intra-op Plan:   Post-operative Plan:   Informed Consent:   Plan Discussed with:   Anesthesia Plan Comments:         Anesthesia Quick Evaluation

## 2017-09-15 NOTE — H&P (Addendum)
OBSTETRIC ADMISSION HISTORY AND PHYSICAL  [name] is a 37 y.o. female [phone] with L/8 at 3131w4d  presenting for active labor.   Reports fetal movement. Denies vaginal bleeding.  [facility]  Support person in labor: none present in MAU  Ultrasounds . Anatomy U/S: limited by maternal body habitus  Prenatal History/Complications: . maternal obesity . Advanced maternal age . Hx of GHTN  Past Medical History: Past Medical History:  Diagnosis Date  . Bursitis   . Pregnancy induced hypertension   . Sickle cell trait (HCC)   . Vaginal Pap smear, abnormal     Past Surgical History: Past Surgical History:  Procedure Laterality Date  . leep      Obstetrical History: OB History    Gravida  4   Para  2   Term  2   Preterm  0   AB  1   Living  2     SAB      TAB      Ectopic      Multiple      Live Births              Social History: Social History   Socioeconomic History  . Marital status: Single    Spouse name: Not on file  . Number of children: Not on file  . Years of education: Not on file  . Highest education level: Not on file  Occupational History  . Not on file  Social Needs  . Financial resource strain: Not hard at all  . Food insecurity:    Worry: Never true    Inability: Never true  . Transportation needs:    Medical: No    Non-medical: No  Tobacco Use  . Smoking status: Former Smoker    Packs/day: 0.10    Types: Cigarettes    Last attempt to quit: 09/07/2015    Years since quitting: 2.0  . Smokeless tobacco: Never Used  Substance and Sexual Activity  . Alcohol use: Not Currently    Alcohol/week: 0.0 standard drinks  . Drug use: No  . Sexual activity: Yes    Partners: Male    Birth control/protection: None  Lifestyle  . Physical activity:    Days per week: Not on file    Minutes per session: Not on file  . Stress: Not at all  Relationships  . Social connections:    Talks on  phone: Not on file    Gets together: Not on file    Attends religious service: Not on file    Active member of club or organization: Not on file    Attends meetings of clubs or organizations: Not on file    Relationship status: Not on file  Other Topics Concern  . Not on file  Social History Narrative  . Not on file    Family History: Family History  Problem Relation Age of Onset  . Diabetes Mother   . Hypertension Father     Allergies: No Known Allergies  Medications Prior to Admission  Medication Sig Dispense Refill Last Dose  . docusate sodium (COLACE) 100 MG capsule Take 1 capsule (100 mg total) by mouth 2 (two) times daily. 60 capsule 11 More than a month at Unknown time  . magnesium hydroxide (MILK OF MAGNESIA) 400 MG/5ML suspension Take 30 mLs by mouth at bedtime as needed for mild constipation or moderate constipation. 360 mL 11 More than a month at Unknown time  .  polyethylene glycol (MIRALAX) packet Take 17 g by mouth daily. 30 each 5 More than a month at Unknown time  . Prenat-FeCbn-FeAspGl-FA-Omega (OB COMPLETE PETITE) 35-5-1-200 MG CAPS Take 1 tablet by mouth daily. 30 capsule 12 09/13/2017 at Unknown time  . ranitidine (ZANTAC) 150 MG tablet Take 1 tablet (150 mg total) by mouth 2 (two) times daily. 60 tablet 5 09/13/2017 at Unknown time     Review of Systems  All systems reviewed and negative except as stated in HPI  Blood pressure (!) 143/81, pulse 87, temperature (!) 97.5 F (36.4 C), temperature source Oral, resp. rate 18, last menstrual period 11/20/2016, SpO2 100 %. General appearance: alert, cooperative, appears stated age and mild distress Lungs: no respiratory distress Heart: regular rate  Abdomen: soft, non-tender; gravid b Pelvic: pending Extremities: Homans sign is negative, no sign of DVT Presentation: cephalic Fetal monitoring: in transit from MAU, reactive, category I  Uterine activity: FHR~140s, moderate variability, no accels/decels Dilation:  6 Effacement (%): 90 Station: -2 Exam by:: Zenia ResidesNikki Risheq, RN   Prenatal labs: ABO, Rh: --/--/O POS (08/21 0203) Antibody: NEG (08/21 0203) Rubella: 1.35 (01/21 1632) RPR: Non Reactive (06/14 1100)  HBsAg: Negative (01/21 1632)  HIV: Non Reactive (06/14 1100)  GBS: Negative (08/05 0000)  Glucola: 1hr 169 Genetic screening:  neg  Prenatal Transfer Tool  Maternal Diabetes: No Genetic Screening: Normal, Maternal Ultrasounds/Referrals: Normal  but limited by maternal body habitus Fetal Ultrasounds or other Referrals:  None Maternal Substance Abuse:  No Significant Maternal Medications:  None Significant Maternal Lab Results: None  Results for orders placed or performed during the hospital encounter of 09/15/17 (from the past 24 hour(s))  CBC   Collection Time: 09/15/17  2:03 AM  Result Value Ref Range   WBC 7.7 4.0 - 10.5 K/uL   RBC 4.36 3.87 - 5.11 MIL/uL   Hemoglobin 11.8 (L) 12.0 - 15.0 g/dL   HCT 21.334.3 (L) 08.636.0 - 57.846.0 %   MCV 78.7 78.0 - 100.0 fL   MCH 27.1 26.0 - 34.0 pg   MCHC 34.4 30.0 - 36.0 g/dL   RDW 46.915.3 62.911.5 - 52.815.5 %   Platelets 180 150 - 400 K/uL  Type and screen   Collection Time: 09/15/17  2:03 AM  Result Value Ref Range   ABO/RH(D) O POS    Antibody Screen NEG    Sample Expiration      09/18/2017 Performed at [facility], [address] [place], [place] [id number]   Results for orders placed or performed during the hospital encounter of 09/14/17 (from the past 24 hour(s))  Urinalysis, Routine w reflex microscopic   Collection Time: 09/14/17  9:12 AM  Result Value Ref Range   Color, Urine YELLOW YELLOW   APPearance HAZY (A) CLEAR   Specific Gravity, Urine 1.011 1.005 - 1.030   pH 6.0 5.0 - 8.0   Glucose, UA NEGATIVE NEGATIVE mg/dL   Hgb urine dipstick NEGATIVE NEGATIVE   Bilirubin Urine NEGATIVE NEGATIVE   Ketones, ur 5 (A) NEGATIVE mg/dL   Protein, ur NEGATIVE NEGATIVE mg/dL   Nitrite NEGATIVE NEGATIVE   Leukocytes, UA SMALL (A) NEGATIVE    RBC / HPF 0-5 0 - 5 RBC/hpf   WBC, UA 0-5 0 - 5 WBC/hpf   Bacteria, UA RARE (A) NONE SEEN   Squamous Epithelial / LPF 0-5 0 - 5   Mucus PRESENT   Protein / creatinine ratio, urine   Collection Time: 09/14/17  9:12 AM  Result Value Ref Range  Creatinine, Urine 94.00 mg/dL   Total Protein, Urine 11 mg/dL   Protein Creatinine Ratio 0.12 0.00 - 0.15 mg/mg[Cre]  CBC   Collection Time: 09/14/17  9:27 AM  Result Value Ref Range   WBC 8.1 4.0 - 10.5 K/uL   RBC 4.38 3.87 - 5.11 MIL/uL   Hemoglobin 11.8 (L) 12.0 - 15.0 g/dL   HCT 16.1 (L) 09.6 - 04.5 %   MCV 79.2 78.0 - 100.0 fL   MCH 26.9 26.0 - 34.0 pg   MCHC 34.0 30.0 - 36.0 g/dL   RDW 40.9 81.1 - 91.4 %   Platelets 168 150 - 400 K/uL  Comprehensive metabolic panel   Collection Time: 09/14/17  9:27 AM  Result Value Ref Range   Sodium 135 135 - 145 mmol/L   Potassium 3.5 3.5 - 5.1 mmol/L   Chloride 104 98 - 111 mmol/L   CO2 19 (L) 22 - 32 mmol/L   Glucose, Bld 95 70 - 99 mg/dL   BUN 5 (L) 6 - 20 mg/dL   Creatinine, Ser 7.82 0.44 - 1.00 mg/dL   Calcium 9.0 8.9 - 95.6 mg/dL   Total Protein 7.2 6.5 - 8.1 g/dL   Albumin 3.2 (L) 3.5 - 5.0 g/dL   AST 19 15 - 41 U/L   ALT 15 0 - 44 U/L   Alkaline Phosphatase 151 (H) 38 - 126 U/L   Total Bilirubin 0.4 0.3 - 1.2 mg/dL   GFR calc non Af Amer >60 >60 mL/min   GFR calc Af Amer >60 >60 mL/min   Anion gap 12 5 - 15    Patient Active Problem List   Diagnosis Date Noted  . Labor and delivery indication for care or intervention 09/15/2017  . Hereditary disease in family possibly affecting fetus, affecting management of mother, antepartum condition or complication, not applicable or unspecified fetus   . Sickle cell trait (HCC) 02/18/2017  . Vitamin D deficiency 02/18/2017  . Supervision of normal pregnancy, antepartum 02/15/2017  . Advanced maternal age in multigravida, second trimester 02/15/2017  . History of pregnancy induced hypertension 02/15/2017    Assessment/Plan:  LARENA OHNEMUS is a 37 y.o. O1H0865 at [redacted]w[redacted]d here for active labor  Labor: Active.  -- pain control: wants epidural  Fetal Wellbeing: Cephalic by [place].  -- GBS (neg) -- continuous fetal monitoring by external   Postpartum Planning -- breast and bottle/nexplanon/circ -- RI, [x] Tdap   Marthenia Rolling, DO  Attestation: I have reviewed the resident's documentation, and I agree with the plan as presented. I have separately seen and examined the patient.   Cristal Deer. Earlene Plater, DO OB/GYN Fellow

## 2017-09-15 NOTE — Anesthesia Procedure Notes (Signed)
Epidural Patient location during procedure: OB Start time: 09/15/2017 2:53 AM End time: 09/15/2017 3:11 AM  Staffing Anesthesiologist: Lowella CurbMiller, Chantee Cerino Ray, MD Performed: anesthesiologist   Preanesthetic Checklist Completed: patient identified, site marked, surgical consent, pre-op evaluation, timeout performed, IV checked, risks and benefits discussed and monitors and equipment checked  Epidural Patient position: sitting Prep: ChloraPrep Patient monitoring: heart rate, cardiac monitor, continuous pulse ox and blood pressure Approach: midline Location: L2-L3 Injection technique: LOR saline  Needle:  Needle type: Tuohy  Needle gauge: 17 G Needle length: 9 cm Needle insertion depth: 8 cm Catheter type: closed end flexible Catheter size: 20 Guage Catheter at skin depth: 13 cm Test dose: negative  Assessment Events: blood not aspirated, injection not painful, no injection resistance, negative IV test and no paresthesia  Additional Notes Reason for block:procedure for pain

## 2017-09-15 NOTE — Anesthesia Postprocedure Evaluation (Signed)
Anesthesia Post Note  Patient: [name]  Procedure(s) Performed: AN AD HOC LABOR EPIDURAL     Patient location during evaluation: Mother Baby Anesthesia Type: Epidural Level of consciousness: awake and alert Pain management: pain level controlled Vital Signs Assessment: post-procedure vital signs reviewed and stable Respiratory status: spontaneous breathing, nonlabored ventilation and respiratory function stable Cardiovascular status: stable Postop Assessment: no headache, no backache, epidural receding, patient able to bend at knees, no apparent nausea or vomiting, able to ambulate and adequate PO intake Anesthetic complications: no    Last Vitals:  Vitals:   09/15/17 0730 09/15/17 1056  BP: 105/67 (!) 105/57  Pulse: (!) 101 83  Resp: 16 18  Temp: 37 C 36.8 C  SpO2:      Last Pain:  Vitals:   09/15/17 1056  TempSrc: Oral  PainSc: 5    Pain Goal: Patients Stated Pain Goal: 3 (09/15/17 1056)               Robin Duncan

## 2017-09-16 ENCOUNTER — Ambulatory Visit (HOSPITAL_COMMUNITY): Payer: BC Managed Care – PPO

## 2017-09-16 MED ORDER — IBUPROFEN 600 MG PO TABS: 600.0000 mg | ORAL_TABLET | Freq: Four times a day (QID) | ORAL | 0 refills | Status: DC | PRN

## 2017-09-16 MED ORDER — TETANUS-DIPHTH-ACELL PERTUSSIS 5-2.5-18.5 LF-MCG/0.5 IM SUSP
0.5000 mL | Freq: Once | INTRAMUSCULAR | Status: AC
  Administered 2017-09-16: 0.5 mL via INTRAMUSCULAR
  Filled 2017-09-16: qty 0.5

## 2017-09-16 NOTE — Discharge Summary (Signed)
OB Discharge Summary     Patient Name: Robin Duncan DOB: 1980/03/05 MRN: [id number]  Date of admission: 09/15/2017 Delivering MD: Tamera Stands   Date of discharge: 09/16/2017  Admitting diagnosis: 38 WEEKS CTX  Intrauterine pregnancy: [redacted]w[redacted]d     Secondary diagnosis:  Active Problems:   Labor and delivery indication for care or intervention   Vaginal delivery  Additional problems: N/A     Discharge diagnosis: Term Pregnancy Delivered                                                                                                Post partum procedures:N/A  Augmentation: N/A  Complications: None  Hospital course:  Onset of Labor With Vaginal Delivery     37 y.o. yo [id number] at [redacted]w[redacted]d was admitted in Active Labor on 09/15/2017. Patient had an uncomplicated labor course as follows:  Membrane Rupture Time/Date: 4:00 AM ,09/15/2017   Intrapartum Procedures: Episiotomy: None [1]                                         Lacerations:  None [1]  Patient had a delivery of a Viable infant. 09/15/2017  Information for the patient's newborn:  Reilly, Molchan [[id number]]  Delivery Method: Vag-Spont    Pateint had an uncomplicated postpartum course.  She is ambulating, tolerating a regular diet, passing flatus, and urinating well. Patient is discharged home in stable condition on 09/16/17.   Physical exam  Vitals:   09/15/17 1534 09/15/17 1930 09/15/17 2210 09/16/17 0509  BP: 105/65 115/73 (!) 125/91 130/86  Pulse: 90 87 88 82  Resp: 16 18 18 15   Temp: 98.2 F (36.8 C) 98.6 F (37 C) 97.9 F (36.6 C) 97.8 F (36.6 C)  TempSrc: Oral Oral  Oral  SpO2:       General: alert, cooperative and no distress Lochia: appropriate Uterine Fundus: firm Incision: N/A DVT Evaluation: No evidence of DVT seen on physical exam. Labs: Lab Results  Component Value Date   WBC 7.7 09/15/2017   HGB 11.8 (L) 09/15/2017   HCT 34.3 (L) 09/15/2017   MCV 78.7 09/15/2017   PLT 180  09/15/2017   CMP Latest Ref Rng & Units 09/14/2017  Glucose 70 - 99 mg/dL 95  BUN 6 - 20 mg/dL 5(L)  Creatinine 1.30 - 1.00 mg/dL 8.65  Sodium 784 - 696 mmol/L 135  Potassium 3.5 - 5.1 mmol/L 3.5  Chloride 98 - 111 mmol/L 104  CO2 22 - 32 mmol/L 19(L)  Calcium 8.9 - 10.3 mg/dL 9.0  Total Protein 6.5 - 8.1 g/dL 7.2  Total Bilirubin 0.3 - 1.2 mg/dL 0.4  Alkaline Phos 38 - 126 U/L 151(H)  AST 15 - 41 U/L 19  ALT 0 - 44 U/L 15    Discharge instruction: per After Visit Summary and "Baby and Me Booklet".  After visit meds:  Allergies as of 09/16/2017   No Known Allergies     Medication List    STOP taking  these medications   OB COMPLETE PETITE 35-5-1-200 MG Caps   ranitidine 150 MG tablet Commonly known as:  ZANTAC     TAKE these medications   ibuprofen 600 MG tablet Commonly known as:  ADVIL,MOTRIN Take 1 tablet (600 mg total) by mouth every 6 (six) hours as needed for moderate pain.       Diet: routine diet  Activity: Advance as tolerated. Pelvic rest for 6 weeks.   Outpatient follow up:4 weeks Follow up Appt:No future appointments. Follow up Visit:No follow-ups on file.  Postpartum contraception: Nexplanon  Newborn Data: Live born female  Birth Weight: 7 lb 9.2 oz (3436 g) APGAR: 7, 9  Newborn Delivery   Birth date/time:  09/15/2017 04:37:00 Delivery type:  Vaginal, Spontaneous     Baby Feeding: Bottle Disposition:home with mother   09/16/2017 Robin Duncan, CNM

## 2017-09-16 NOTE — Discharge Instructions (Signed)
Vaginal Delivery, Care After °Refer to this sheet in the next few weeks. These instructions provide you with information about caring for yourself after vaginal delivery. Your health care provider may also give you more specific instructions. Your treatment has been planned according to current medical practices, but problems sometimes occur. Call your health care provider if you have any problems or questions. °What can I expect after the procedure? °After vaginal delivery, it is common to have: °· Some bleeding from your vagina. °· Soreness in your abdomen, your vagina, and the area of skin between your vaginal opening and your anus (perineum). °· Pelvic cramps. °· Fatigue. ° °Follow these instructions at home: °Medicines °· Take over-the-counter and prescription medicines only as told by your health care provider. °· If you were prescribed an antibiotic medicine, take it as told by your health care provider. Do not stop taking the antibiotic until it is finished. °Driving ° °· Do not drive or operate heavy machinery while taking prescription pain medicine. °· Do not drive for 24 hours if you received a sedative. °Lifestyle °· Do not drink alcohol. This is especially important if you are breastfeeding or taking medicine to relieve pain. °· Do not use tobacco products, including cigarettes, chewing tobacco, or e-cigarettes. If you need help quitting, ask your health care provider. °Eating and drinking °· Drink at least 8 eight-ounce glasses of water every day unless you are told not to by your health care provider. If you choose to breastfeed your baby, you may need to drink more water than this. °· Eat high-fiber foods every day. These foods may help prevent or relieve constipation. High-fiber foods include: °? Whole grain cereals and breads. °? Brown rice. °? Beans. °? Fresh fruits and vegetables. °Activity °· Return to your normal activities as told by your health care provider. Ask your health care provider  what activities are safe for you. °· Rest as much as possible. Try to rest or take a nap when your baby is sleeping. °· Do not lift anything that is heavier than your baby or 10 lb (4.5 kg) until your health care provider says that it is safe. °· Talk with your health care provider about when you can engage in sexual activity. This may depend on your: °? Risk of infection. °? Rate of healing. °? Comfort and desire to engage in sexual activity. °Vaginal Care °· If you have an episiotomy or a vaginal tear, check the area every day for signs of infection. Check for: °? More redness, swelling, or pain. °? More fluid or blood. °? Warmth. °? Pus or a bad smell. °· Do not use tampons or douches until your health care provider says this is safe. °· Watch for any blood clots that may pass from your vagina. These may look like clumps of dark red, brown, or black discharge. °General instructions °· Keep your perineum clean and dry as told by your health care provider. °· Wear loose, comfortable clothing. °· Wipe from front to back when you use the toilet. °· Ask your health care provider if you can shower or take a bath. If you had an episiotomy or a perineal tear during labor and delivery, your health care provider may tell you not to take baths for a certain length of time. °· Wear a bra that supports your breasts and fits you well. °· If possible, have someone help you with household activities and help care for your baby for at least a few days after   you leave the hospital. °· Keep all follow-up visits for you and your baby as told by your health care provider. This is important. °Contact a health care provider if: °· You have: °? Vaginal discharge that has a bad smell. °? Difficulty urinating. °? Pain when urinating. °? A sudden increase or decrease in the frequency of your bowel movements. °? More redness, swelling, or pain around your episiotomy or vaginal tear. °? More fluid or blood coming from your episiotomy or  vaginal tear. °? Pus or a bad smell coming from your episiotomy or vaginal tear. °? A fever. °? A rash. °? Little or no interest in activities you used to enjoy. °? Questions about caring for yourself or your baby. °· Your episiotomy or vaginal tear feels warm to the touch. °· Your episiotomy or vaginal tear is separating or does not appear to be healing. °· Your breasts are painful, hard, or turn red. °· You feel unusually sad or worried. °· You feel nauseous or you vomit. °· You pass large blood clots from your vagina. If you pass a blood clot from your vagina, save it to show to your health care provider. Do not flush blood clots down the toilet without having your health care provider look at them. °· You urinate more than usual. °· You are dizzy or light-headed. °· You have not breastfed at all and you have not had a menstrual period for 12 weeks after delivery. °· You have stopped breastfeeding and you have not had a menstrual period for 12 weeks after you stopped breastfeeding. °Get help right away if: °· You have: °? Pain that does not go away or does not get better with medicine. °? Chest pain. °? Difficulty breathing. °? Blurred vision or spots in your vision. °? Thoughts about hurting yourself or your baby. °· You develop pain in your abdomen or in one of your legs. °· You develop a severe headache. °· You faint. °· You bleed from your vagina so much that you fill two sanitary pads in one hour. °This information is not intended to replace advice given to you by your health care provider. Make sure you discuss any questions you have with your health care provider. °Document Released: 01/10/2000 Document Revised: 06/26/2015 Document Reviewed: 01/27/2015 °Elsevier Interactive Patient Education © 2018 [employer] ° °

## 2017-09-18 ENCOUNTER — Inpatient Hospital Stay (HOSPITAL_COMMUNITY): Admission: RE | Admit: 2017-09-18 | Payer: BC Managed Care – PPO | Source: Ambulatory Visit

## 2017-09-20 ENCOUNTER — Encounter: Payer: BC Managed Care – PPO | Admitting: Obstetrics and Gynecology

## 2017-09-21 ENCOUNTER — Emergency Department (HOSPITAL_COMMUNITY)
Admission: EM | Admit: 2017-09-21 | Discharge: 2017-09-21 | Disposition: A | Discharge status: Home / Self Care | Payer: BC Managed Care – PPO | Attending: Emergency Medicine | Admitting: Emergency Medicine

## 2017-09-21 ENCOUNTER — Encounter (HOSPITAL_COMMUNITY): Payer: Self-pay | Admitting: Emergency Medicine

## 2017-09-21 DIAGNOSIS — R51 Headache: Secondary | ICD-10-CM | POA: Insufficient documentation

## 2017-09-21 DIAGNOSIS — R519 Headache, unspecified: Secondary | ICD-10-CM

## 2017-09-21 DIAGNOSIS — Z87891 Personal history of nicotine dependence: Secondary | ICD-10-CM | POA: Insufficient documentation

## 2017-09-21 LAB — CBC WITH DIFFERENTIAL/PLATELET
ABS IMMATURE GRANULOCYTES: 0.1 10*3/uL (ref 0.0–0.1)
BASOS ABS: 0 10*3/uL (ref 0.0–0.1)
BASOS PCT: 0 %
Eosinophils Absolute: 0.2 10*3/uL (ref 0.0–0.7)
Eosinophils Relative: 3 %
HCT: 37.3 % (ref 36.0–46.0)
Hemoglobin: 11.9 g/dL — ABNORMAL LOW (ref 12.0–15.0)
Immature Granulocytes: 1 %
Lymphocytes Relative: 30 %
Lymphs Abs: 1.8 10*3/uL (ref 0.7–4.0)
MCH: 26.8 pg (ref 26.0–34.0)
MCHC: 31.9 g/dL (ref 30.0–36.0)
MCV: 84 fL (ref 78.0–100.0)
MONO ABS: 0.5 10*3/uL (ref 0.1–1.0)
Monocytes Relative: 8 %
NEUTROS ABS: 3.4 10*3/uL (ref 1.7–7.7)
Neutrophils Relative %: 58 %
PLATELETS: 211 10*3/uL (ref 150–400)
RBC: 4.44 MIL/uL (ref 3.87–5.11)
RDW: 14.6 % (ref 11.5–15.5)
WBC: 5.9 10*3/uL (ref 4.0–10.5)

## 2017-09-21 LAB — URINALYSIS, ROUTINE W REFLEX MICROSCOPIC
Bilirubin Urine: NEGATIVE
Glucose, UA: NEGATIVE mg/dL
Ketones, ur: NEGATIVE mg/dL
NITRITE: NEGATIVE
PH: 6 (ref 5.0–8.0)
Protein, ur: 30 mg/dL — AB
Specific Gravity, Urine: 1.009 (ref 1.005–1.030)
WBC, UA: >50 WBC/hpf — ABNORMAL HIGH (ref 0–5)

## 2017-09-21 LAB — COMPREHENSIVE METABOLIC PANEL
ALT: 31 U/L (ref 0–44)
AST: 35 U/L (ref 15–41)
Albumin: 2.8 g/dL — ABNORMAL LOW (ref 3.5–5.0)
Alkaline Phosphatase: 112 U/L (ref 38–126)
Anion gap: 10 (ref 5–15)
BILIRUBIN TOTAL: 0.6 mg/dL (ref 0.3–1.2)
BUN: 5 mg/dL — ABNORMAL LOW (ref 6–20)
CALCIUM: 8.9 mg/dL (ref 8.9–10.3)
CO2: 23 mmol/L (ref 22–32)
CREATININE: 0.67 mg/dL (ref 0.44–1.00)
Chloride: 109 mmol/L (ref 98–111)
GFR calc Af Amer: >60 mL/min (ref >60)
Glucose, Bld: 78 mg/dL (ref 70–99)
Potassium: 3.8 mmol/L (ref 3.5–5.1)
Sodium: 142 mmol/L (ref 135–145)
TOTAL PROTEIN: 6 g/dL — AB (ref 6.5–8.1)

## 2017-09-21 MED ORDER — MAGNESIUM SULFATE 2 GM/50ML IV SOLN
2.0000 g | Freq: Once | INTRAVENOUS | Status: AC
  Administered 2017-09-21: 2 g via INTRAVENOUS
  Filled 2017-09-21: qty 50

## 2017-09-21 NOTE — ED Provider Notes (Signed)
[name] EMERGENCY DEPARTMENT Provider Note   CSN: [id number] Arrival date & time: 09/21/17  0709     History   Chief Complaint Chief Complaint  Patient presents with  . Headache    HPI Robin Duncan is a 37 y.o. female.  Patient who is 6 days post-partum without complications -- presents with c/o frontal HA, blurred vision, hand and ankle swelling starting 2 days ago. She does not report high blood pressure during pregnancy and this is confirmed by her recent records. No N/V/D. No CP or abdominal pain. She has some residual vaginal bleeding that is mild. No confusion, fever, nasal congestion or toothache.  Denies change in urine. This was her third pregnancy.  She reports having a headache and elevated blood pressure after her second pregnancy but cannot remember a diagnosis because it has been 10 years since that occurred.  She does remember going to [facility] one time for headache and and being transferred to [facility] for treatment and discharged home. The onset of this condition was acute. The course is constant. Aggravating factors: none. Alleviating factors: none.       Past Medical History:  Diagnosis Date  . Bursitis   . Pregnancy induced hypertension   . Sickle cell trait (HCC)   . Vaginal Pap smear, abnormal     Patient Active Problem List   Diagnosis Date Noted  . Labor and delivery indication for care or intervention 09/15/2017  . Vaginal delivery 09/15/2017  . Hereditary disease in family possibly affecting fetus, affecting management of mother, antepartum condition or complication, not applicable or unspecified fetus   . Sickle cell trait (HCC) 02/18/2017  . Vitamin D deficiency 02/18/2017  . Supervision of normal pregnancy, antepartum 02/15/2017  . Advanced maternal age in multigravida, second trimester 02/15/2017  . History of pregnancy induced hypertension 02/15/2017    Past Surgical History:  Procedure Laterality Date   . leep       OB History    Gravida  4   Para  3   Term  3   Preterm  0   AB  1   Living  3     SAB      TAB      Ectopic      Multiple  0   Live Births  1            Home Medications    Prior to Admission medications   Medication Sig Start Date End Date Taking? Authorizing Provider  ibuprofen (ADVIL,MOTRIN) 600 MG tablet Take 1 tablet (600 mg total) by mouth every 6 (six) hours as needed for moderate pain. 09/16/17   Calvert Cantor, CNM    Family History Family History  Problem Relation Age of Onset  . Diabetes Mother   . Hypertension Father     Social History Social History   Tobacco Use  . Smoking status: Former Smoker    Packs/day: 0.10    Types: Cigarettes    Last attempt to quit: 09/07/2015    Years since quitting: 2.0  . Smokeless tobacco: Never Used  Substance Use Topics  . Alcohol use: Not Currently    Alcohol/week: 0.0 standard drinks  . Drug use: No     Allergies   Patient has no known allergies.   Review of Systems Review of Systems  Constitutional: Negative for fever.  HENT: Negative for congestion, dental problem, rhinorrhea and sinus pressure.   Eyes: Positive for visual disturbance.  Negative for photophobia, discharge and redness.  Respiratory: Negative for shortness of breath.   Cardiovascular: Positive for leg swelling. Negative for chest pain.  Gastrointestinal: Negative for nausea and vomiting.  Musculoskeletal: Negative for gait problem, neck pain and neck stiffness.  Skin: Negative for rash.  Neurological: Positive for headaches. Negative for syncope, speech difficulty, weakness, light-headedness and numbness.  Psychiatric/Behavioral: Negative for confusion.     Physical Exam Updated Vital Signs BP (!) 152/89 (BP Location: Right Arm)   Pulse 77   Temp 98.6 F (37 C) (Oral)   Resp 16   LMP 11/20/2016 (Approximate) Comment: Pt unsure of LMP  SpO2 100%   Physical Exam  Constitutional: She is oriented  to person, place, and time. She appears well-developed and well-nourished.  HENT:  Head: Normocephalic and atraumatic.  Right Ear: Tympanic membrane, external ear and ear canal normal.  Left Ear: Tympanic membrane, external ear and ear canal normal.  Nose: Nose normal.  Mouth/Throat: Uvula is midline, oropharynx is clear and moist and mucous membranes are normal.  Eyes: Pupils are equal, round, and reactive to light. Conjunctivae, EOM and lids are normal. Right eye exhibits no discharge. Left eye exhibits no discharge. Right eye exhibits no nystagmus. Left eye exhibits no nystagmus.  Neck: Normal range of motion. Neck supple.  Cardiovascular: Normal rate, regular rhythm and normal heart sounds.  Pulmonary/Chest: Effort normal and breath sounds normal.  Abdominal: Soft. There is no tenderness.  Musculoskeletal: She exhibits edema.       Cervical back: She exhibits normal range of motion, no tenderness and no bony tenderness.  Trace ankle and hand edema.   Neurological: She is alert and oriented to person, place, and time. She has normal strength and normal reflexes. No cranial nerve deficit or sensory deficit. She displays a negative Romberg sign. Coordination and gait normal. GCS eye subscore is 4. GCS verbal subscore is 5. GCS motor subscore is 6.  Skin: Skin is warm and dry.  Psychiatric: She has a normal mood and affect.  Nursing note and vitals reviewed.    ED Treatments / Results  Labs (all labs ordered are listed, but only abnormal results are displayed) Labs Reviewed  CBC WITH DIFFERENTIAL/PLATELET  URINALYSIS, ROUTINE W REFLEX MICROSCOPIC  COMPREHENSIVE METABOLIC PANEL    EKG None  Radiology No results found.  Procedures Procedures (including critical care time)  Medications Ordered in ED Medications - No data to display   Initial Impression / Assessment and Plan / ED Course  I have reviewed the triage vital signs and the nursing notes.  Pertinent labs & imaging  results that were available during my care of the patient were reviewed by me and considered in my medical decision making (see chart for details).     Patient seen and examined. Will need evaluation for postpartum preeclampsia. CBC, CMP, UA ordered. Will recheck BP, treat if continues to be high. Pt seems anxious.    Vital signs reviewed and are as follows: BP (!) 152/89 (BP Location: Right Arm)   Pulse 77   Temp 98.6 F (37 C) (Oral)   Resp 16   LMP 11/20/2016 (Approximate) Comment: Pt unsure of LMP  SpO2 100%   8:39 AM Labs without low platelets, elevated liver enzymes. I escorted patient to the bathroom to obtain urine specimen. This is pending.   10:23 AM patient has not had resolution of symptoms, however much improvement in her symptoms after magnesium.  Blood pressure recheck is 123/80.  I spoke with  on-call OB/GYN at [facility], they recommend visit to [facility] tomorrow for blood pressure recheck.  Patient updated and she agrees with this plan.  Patient counseled to return if they have weakness in their arms or legs, slurred speech, trouble walking or talking, confusion, trouble with their balance, or if they have any other concerns. Patient verbalizes understanding and agrees with plan.    Final Clinical Impressions(s) / ED Diagnoses   Final diagnoses:  Acute nonintractable headache, unspecified headache type   Patient with headache in setting of recent delivery 6 days ago.  She has had some blurry vision, swelling in her arms and legs with some numbness and tingling in her upper extremities bilaterally.  No weakness.  Blood pressure elevated on arrival, improved without treatment in the emergency department.  Discussed with OB/GYN. She will have BP recheck tomorrow.   ED Discharge Orders    None       Renne Crigler, Cordelia Poche 09/21/17 1026    Tegeler, Canary Brim, MD 09/21/17 1630

## 2017-09-21 NOTE — ED Triage Notes (Signed)
Pt arrives to ED with headache for the last 3 days. Pt states she is 6 days post partum and has been having bilateral arm numbness.

## 2017-09-21 NOTE — Discharge Instructions (Signed)
Please read and follow all provided instructions.  Your diagnoses today include:  1. Acute nonintractable headache, unspecified headache type     Tests performed today include:  Vital signs. See below for your results today.   Blood counts and electrolytes - normal liver functions  Urine test - showed blood and small amount of protein  Medications:  In the Emergency Department you received:  Magnesium  Take any prescribed medications only as directed.  Additional information:  Follow any educational materials contained in this packet.  You are having a headache. No specific cause was found today for your headache. It may have been a migraine or other cause of headache. Stress, anxiety, fatigue, and depression are common triggers for headaches.   Your headache today does not appear to be life-threatening or require hospitalization, but often the exact cause of headaches is not determined in the emergency department. Therefore, follow-up with your doctor is very important to find out what may have caused your headache and whether or not you need any further diagnostic testing or treatment.   Sometimes headaches can appear benign (not harmful), but then more serious symptoms can develop which should prompt an immediate re-evaluation by your doctor or the emergency department.  BE VERY CAREFUL not to take multiple medicines containing Tylenol (also called acetaminophen). Doing so can lead to an overdose which can damage your liver and cause liver failure and possibly death.   Follow-up instructions: Please follow-up with [facility] tomorrow for a blood pressure recheck.   Return instructions:   Please return to the Emergency Department if you experience worsening symptoms.  Return if the medications do not resolve your headache, if it recurs, or if you have multiple episodes of vomiting or cannot keep down fluids.  Return if you have a change from the usual headache.  RETURN  IMMEDIATELY IF you:  Develop a sudden, severe headache  Develop confusion or become poorly responsive or faint  Develop a fever above 100.55F or problem breathing  Have a change in speech, vision, swallowing, or understanding  Develop new weakness, numbness, tingling, incoordination in your arms or legs  Have a seizure  Please return if you have any other emergent concerns.  Additional Information:  Your vital signs today were: BP 123/80 (BP Location: Right Arm)    Pulse 69    Temp 98.6 F (37 C) (Oral)    Resp 20    LMP 11/20/2016 (Approximate) Comment: Pt unsure of LMP   SpO2 100%  If your blood pressure (BP) was elevated above 135/85 this visit, please have this repeated by your doctor within one month. --------------

## 2017-09-22 ENCOUNTER — Ambulatory Visit: Payer: BC Managed Care – PPO

## 2017-09-22 DIAGNOSIS — Z8759 Personal history of other complications of pregnancy, childbirth and the puerperium: Secondary | ICD-10-CM

## 2017-09-22 MED ORDER — AMLODIPINE BESYLATE 10 MG PO TABS: 10.0000 mg | ORAL_TABLET | Freq: Every day | ORAL | 0 refills | Status: DC

## 2017-09-22 NOTE — Progress Notes (Signed)
Subjective:  Robin Duncan is a 37 y.o. female here for BP check. Hx of PIH no meds. NSVD 09/15/17.   Hypertension ROS: no TIA's, possible neurological symptoms headaches, no chest pain on exertion, no dyspnea on exertion, no swelling of ankles and no blurred vision since yesterday at the ER. .    Objective:  LMP 11/20/16  Appearance alert, well appearing, and in no distress. General exam BP noted to be well controlled today in office.    Assessment:   Blood Pressure needs improvement.   Plan:  Follow up in 1 week.  Start Norvasc 10 mg 1 tab po qd #30 Contact the office if sx's worsens.

## 2017-09-23 ENCOUNTER — Other Ambulatory Visit (HOSPITAL_COMMUNITY): Payer: BC Managed Care – PPO

## 2017-09-24 NOTE — Progress Notes (Signed)
BP elevated. No HA, visual changes or epigastric pain. Will start Norvasc and recheck BP In 1 week.

## 2017-09-28 ENCOUNTER — Ambulatory Visit: Payer: BC Managed Care – PPO | Admitting: *Deleted

## 2017-09-28 VITALS — BP 130/83 | HR 91 | Wt 239.0 lb

## 2017-09-28 DIAGNOSIS — Z8759 Personal history of other complications of pregnancy, childbirth and the puerperium: Secondary | ICD-10-CM

## 2017-09-28 NOTE — Progress Notes (Signed)
Subjective:  Robin Duncan is a 37 y.o. female here for BP check.   Hypertension ROS: taking medications as instructed, no medication side effects noted, no TIA's, no chest pain on exertion, no dyspnea on exertion and no swelling of ankles.    Objective:  BP 130/83   Pulse 91   Wt 239 lb (108.4 kg)   LMP 11/20/2016 (Approximate) Comment: Pt unsure of LMP  BMI 43.71 kg/m   Appearance alert, well appearing, and in no distress. General exam BP noted to be well controlled today in office.    Assessment:   Blood Pressure stable.   Plan:  Current treatment plan is effective, no change in therapy.  Pt to remain on Amlodipine until PP visit.

## 2017-09-29 NOTE — Progress Notes (Signed)
Agree with A & P. 

## 2017-10-14 ENCOUNTER — Encounter: Payer: Self-pay | Admitting: Obstetrics and Gynecology

## 2017-10-14 ENCOUNTER — Ambulatory Visit (INDEPENDENT_AMBULATORY_CARE_PROVIDER_SITE_OTHER): Payer: BC Managed Care – PPO | Admitting: Obstetrics and Gynecology

## 2017-10-14 DIAGNOSIS — Z3202 Encounter for pregnancy test, result negative: Secondary | ICD-10-CM

## 2017-10-14 DIAGNOSIS — Z1389 Encounter for screening for other disorder: Secondary | ICD-10-CM | POA: Diagnosis not present

## 2017-10-14 DIAGNOSIS — Z30017 Encounter for initial prescription of implantable subdermal contraceptive: Secondary | ICD-10-CM

## 2017-10-14 DIAGNOSIS — Z3046 Encounter for surveillance of implantable subdermal contraceptive: Secondary | ICD-10-CM | POA: Diagnosis not present

## 2017-10-14 LAB — POCT URINE PREGNANCY: PREG TEST UR: NEGATIVE

## 2017-10-14 MED ORDER — ETONOGESTREL 68 MG ~~LOC~~ IMPL
68.0000 mg | DRUG_IMPLANT | Freq: Once | SUBCUTANEOUS | Status: AC
  Administered 2017-10-14: 68 mg via SUBCUTANEOUS

## 2017-10-14 NOTE — Progress Notes (Signed)
Post Partum Exam  [name] is a 37 y.o. [phone] female who presents for a postpartum visit. She is 4 weeks postpartum following a spontaneous vaginal delivery. I have fully reviewed the prenatal and intrapartum course. The delivery was at 38.3 gestational weeks.  Anesthesia: epidural. Postpartum course has been doing well. Baby's course has been doing well. Baby is feeding by bottle - Similac Alimentum. Bleeding no bleeding. Bowel function is normal. Bladder function is normal. Patient is not sexually active. Contraception method is abstinence. Postpartum depression screening:neg, score 4.    Last pap smear done 04/2016 and was Normal  Review of Systems Pertinent items are noted in HPI.    Objective:  Blood pressure 118/80, pulse 83, weight 242 lb (109.8 kg), last menstrual period 11/20/2016, unknown if currently breastfeeding.  General:  alert, cooperative and no distress   Breasts:  inspection negative, no nipple discharge or bleeding, no masses or nodularity palpable  Lungs: clear to auscultation bilaterally  Heart:  regular rate and rhythm  Abdomen: soft, non-tender; bowel sounds normal; no masses,  no organomegaly   Vulva:  normal  Vagina: normal vagina, no discharge, exudate, lesion, or erythema  Cervix:  multiparous appearance  Corpus: normal size, contour, position, consistency, mobility, non-tender  Adnexa:  no mass, fullness, tenderness  Rectal Exam: Not performed.        Assessment:    Normal postpartum exam. Pap smear not done at today's visit.   Plan:   1. Contraception: Nexplanon  Patient given informed consent, signed copy in the chart, time out was performed. Pregnancy test was negative. Appropriate time out taken.  Patient's left arm was prepped and draped in the usual sterile fashion. The ruler used to measure and mark insertion area.  Patient was prepped with alcohol swab and then injected with 2 cc of 1% lidocaine with epinephrine.  Patient was prepped  with betadine, Nexplanon removed form packaging.  Device confirmed in needle, then inserted full length of needle and withdrawn per handbook instructions.  Patient insertion site covered with band-aid and [place].   Minimal blood loss.  Patient tolerated the procedure well.   2. Patient is medically cleared to resume all activities of daily living. Patient normotensive. Advised patient to ckeck BP in 1 week and to follow up with PCP if persistently greater than 140/90 3. Follow up in: 6 months or as needed.

## 2017-10-14 NOTE — Addendum Note (Signed)
Addended by: Marya LandryFOSTER, Jacarri Gesner D on: 10/14/2017 10:44 AM   Modules accepted: Orders

## 2017-10-14 NOTE — Addendum Note (Signed)
Addended by: Marya LandryFOSTER, Jadynn Epping D on: 10/14/2017 11:23 AM   Modules accepted: Orders

## 2017-10-17 ENCOUNTER — Ambulatory Visit (HOSPITAL_COMMUNITY)
Admission: EM | Admit: 2017-10-17 | Discharge: 2017-10-17 | Disposition: A | Discharge status: Home / Self Care | Payer: BC Managed Care – PPO | Attending: Urgent Care | Admitting: Urgent Care

## 2017-10-17 ENCOUNTER — Encounter (HOSPITAL_COMMUNITY): Payer: Self-pay | Admitting: Urgent Care

## 2017-10-17 DIAGNOSIS — I10 Essential (primary) hypertension: Secondary | ICD-10-CM

## 2017-10-17 DIAGNOSIS — M25532 Pain in left wrist: Secondary | ICD-10-CM | POA: Diagnosis not present

## 2017-10-17 DIAGNOSIS — R030 Elevated blood-pressure reading, without diagnosis of hypertension: Secondary | ICD-10-CM

## 2017-10-17 MED ORDER — METHYLPREDNISOLONE SODIUM SUCC 125 MG IJ SOLR
80.0000 mg | Freq: Once | INTRAMUSCULAR | Status: AC
  Administered 2017-10-17: 80 mg via INTRAMUSCULAR

## 2017-10-17 MED ORDER — AMLODIPINE BESYLATE 10 MG PO TABS: 10.0000 mg | ORAL_TABLET | Freq: Every day | ORAL | 0 refills | Status: DC

## 2017-10-17 MED ORDER — METHYLPREDNISOLONE SODIUM SUCC 125 MG IJ SOLR
INTRAMUSCULAR | Status: AC
  Filled 2017-10-17: qty 2

## 2017-10-17 MED ORDER — CYCLOBENZAPRINE HCL 5 MG PO TABS: 5.0000 mg | ORAL_TABLET | Freq: Every day | ORAL | 0 refills | Status: DC

## 2017-10-17 NOTE — ED Triage Notes (Signed)
Pt presents with left wrist pain not associated with any injury or anything in particular.

## 2017-10-17 NOTE — ED Provider Notes (Signed)
  MRN: [id number] DOB: 05/17/1980  Subjective:   Robin Duncan is a 37 y.o. female presenting for 3 week history of progressively worsening mostly constant aching/sharp left wrist pain.  Denies trauma, fall, inciting factors. Has a history of HTN, did not take her BP medication today. Has a baby, born 09/15/2017.  Has a history of carpal tunnel syndrome in the same wrist.  Patient is not breast-feeding.  No current facility-administered medications for this encounter.   Current Outpatient Medications:  .  amLODipine (NORVASC) 10 MG tablet, Take 1 tablet (10 mg total) by mouth daily., Disp: 30 tablet, Rfl: 0 .  ibuprofen (ADVIL,MOTRIN) 600 MG tablet, Take 1 tablet (600 mg total) by mouth every 6 (six) hours as needed for moderate pain., Disp: 30 tablet, Rfl: 0 .  prenatal vitamin w/FE, FA (PRENATAL 1 + 1) 27-1 MG TABS tablet, Take 1 tablet by mouth daily at 12 noon., Disp: , Rfl:    No Known Allergies  Past Medical History:  Diagnosis Date  . Bursitis   . Pregnancy induced hypertension   . Sickle cell trait (HCC)   . Vaginal Pap smear, abnormal      Past Surgical History:  Procedure Laterality Date  . leep      Objective:   Vitals: BP (!) 179/95 (BP Location: Right Arm)   Pulse 82   Temp 97.7 F (36.5 C) (Oral)   Resp 20   LMP 11/20/2016 (Approximate) Comment: Pt unsure of LMP  SpO2 98%   BP Readings from Last 3 Encounters:  10/17/17 (!) 179/95  10/14/17 118/80  09/28/17 130/83    Physical Exam  Constitutional: She is oriented to person, place, and time. She appears well-developed and well-nourished.  Cardiovascular: Normal rate.  Pulmonary/Chest: Effort normal.  Musculoskeletal:       Left wrist: She exhibits tenderness (over area depicted). She exhibits normal range of motion, no bony tenderness, no swelling, no effusion, no crepitus and no deformity.       Arms: Neurological: She is alert and oriented to person, place, and time.   Assessment and Plan :    Left wrist pain  Essential hypertension  Elevated blood pressure reading  We will manage with thumb spica splint, IM steroid injection and rest.  I suspect the patient's symptoms are due to overuse and lack of rest given that she just had a baby and is having to take care of them.  Counseled on management of her hypertension including medical complaints.  She is to follow-up with her PCP, provided her with a 15-day supply of her amlodipine as she is almost out. Counseled patient on potential for adverse effects with medications prescribed today, patient verbalized understanding. Return-to-clinic precautions discussed, patient verbalized understanding.    Wallis BambergMani, [name], PA-C 10/17/17 1203

## 2018-05-12 ENCOUNTER — Encounter: Payer: Self-pay | Admitting: Podiatry

## 2018-05-12 ENCOUNTER — Other Ambulatory Visit: Payer: Self-pay

## 2018-05-12 ENCOUNTER — Ambulatory Visit: Payer: BC Managed Care – PPO | Admitting: Podiatry

## 2018-05-12 ENCOUNTER — Ambulatory Visit (INDEPENDENT_AMBULATORY_CARE_PROVIDER_SITE_OTHER): Payer: BC Managed Care – PPO

## 2018-05-12 VITALS — Temp 98.1°F

## 2018-05-12 DIAGNOSIS — M722 Plantar fascial fibromatosis: Secondary | ICD-10-CM

## 2018-05-12 MED ORDER — MELOXICAM 15 MG PO TABS: 15.0000 mg | ORAL_TABLET | Freq: Every day | ORAL | 0 refills | Status: DC

## 2018-05-12 NOTE — Progress Notes (Signed)
Subjective:   Patient ID: Robin Duncan, female   DOB: 38 y.o.   MRN: [id number]   HPI 38 year old female presents the office today for concerns of left heel pain which is been ongoing about 1 year.  She says it hurts if she is been sitting for some time she stands back up.  Also pain is tripping on her feet.  She describes a burning sensation along the refill.  She had times has to walk on her tiptoes.  She has no pain in the back of the heel.  She denies any recent injury or trauma.  She has no numbness or tingling.  The pain does not wake her up at night.  She has no other concerns.   Review of Systems  All other systems reviewed and are negative.  Past Medical History:  Diagnosis Date  . Bursitis   . Pregnancy induced hypertension   . Sickle cell trait (HCC)   . Vaginal Pap smear, abnormal     Past Surgical History:  Procedure Laterality Date  . leep       Current Outpatient Medications:  Marland Kitchen  Multiple Vitamin (MULTIVITAMIN) capsule, Take 1 capsule by mouth daily., Disp: , Rfl:  .  naproxen sodium (ALEVE) 220 MG tablet, Take 220 mg by mouth., Disp: , Rfl:  .  meloxicam (MOBIC) 15 MG tablet, Take 1 tablet (15 mg total) by mouth daily., Disp: 30 tablet, Rfl: 0  No Known Allergies       Objective:  Physical Exam  General: AAO x3, NAD  Dermatological: Skin is warm, dry and supple bilateral. Nails x 10 are well manicured; remaining integument appears unremarkable at this time. There are no open sores, no preulcerative lesions, no rash or signs of infection present.  Vascular: Dorsalis Pedis artery and Posterior Tibial artery pedal pulses are 2/4 bilateral with immedate capillary fill time. Pedal hair growth present. No varicosities and no lower extremity edema present bilateral. There is no pain with calf compression, swelling, warmth, erythema.   Neruologic: Grossly intact via light touch bilateral. Protective threshold with Semmes Wienstein monofilament intact to all  pedal sites bilateral.  Negative Tinel sign.  Musculoskeletal: Tenderness to palpation along the plantar medial tubercle of the calcaneus at the insertion of plantar fascia on the left foot. There is no pain along the course of the plantar fascia within the arch of the foot. Plantar fascia appears to be intact. There is no pain with lateral compression of the calcaneus or pain with vibratory sensation. There is no pain along the course or insertion of the achilles tendon. No other areas of tenderness to bilateral lower extremities.  Gait: Unassisted, Nonantalgic.     Assessment:   38 year old female left heel pain, plantar fasciitis      Plan:  -Treatment options discussed including all alternatives, risks, and complications -Etiology of symptoms were discussed -Steroid injection preformed. See procedure note below -Prescribed mobic. Discussed side effects of the medication and directed to stop if any are to occur and call the office.  -Plantar fascial brace -Night splint dispensed -Discussed shoe modifications and orthotics.  Procedure: Injection Tendon/Ligament Discussed alternatives, risks, complications and verbal consent was obtained.  Location: LEFT plantar fascia at the glabrous junction; medial approach. Skin Prep: Alcohol Injectate: 0.5cc 0.5% marcaine plain, 0.5 cc 2% lidocaine plain and, 1 cc kenalog 10. Disposition: Patient tolerated procedure well. Injection site dressed with a band-aid.  Post-injection care was discussed and return precautions discussed.    Lesia Sago  Ardelle AntonWagoner DPM

## 2018-05-12 NOTE — Patient Instructions (Signed)

## 2018-05-17 ENCOUNTER — Telehealth: Payer: Self-pay | Admitting: *Deleted

## 2018-05-17 NOTE — Telephone Encounter (Signed)
Called and spoke with the patient and the patient stated that the heel pain is better than it was and still hurts some in the am and the shot did help and has been using the brace and the splint with the ice pack and that also helps and overall I am a lot better than I was when I came in and I have an appointment at the end of the month and I will see Dr Ardelle Anton then and I stated to call the office if any concerns or questions at [phone]. Misty Stanley

## 2018-05-26 ENCOUNTER — Ambulatory Visit: Payer: BC Managed Care – PPO | Admitting: Podiatry

## 2018-05-26 DIAGNOSIS — M722 Plantar fascial fibromatosis: Secondary | ICD-10-CM | POA: Insufficient documentation

## 2018-06-28 ENCOUNTER — Ambulatory Visit (INDEPENDENT_AMBULATORY_CARE_PROVIDER_SITE_OTHER): Payer: BC Managed Care – PPO | Admitting: Podiatry

## 2018-06-28 ENCOUNTER — Other Ambulatory Visit: Payer: Self-pay

## 2018-06-28 ENCOUNTER — Encounter: Payer: Self-pay | Admitting: Podiatry

## 2018-06-28 VITALS — Temp 97.5°F

## 2018-06-28 DIAGNOSIS — M722 Plantar fascial fibromatosis: Secondary | ICD-10-CM

## 2018-06-28 MED ORDER — MELOXICAM 15 MG PO TABS: 15.0000 mg | ORAL_TABLET | Freq: Every day | ORAL | 0 refills | Status: DC

## 2018-07-07 NOTE — Progress Notes (Signed)
Subjective: 38 year old female presents the office today for follow-up evaluation of left heel pain.  She states that she still gets discomfort but not quite as bad.  She denies any recent injury or trauma.  She is to begin stretching, icing.  No numbness or tingling. Denies any systemic complaints such as fevers, chills, nausea, vomiting. No acute changes since last appointment, and no other complaints at this time.   Objective: AAO x3, NAD DP/PT pulses palpable bilaterally, CRT less than 3 seconds There is improved but still tenderness palpation of plantar medial tubercle of the calcaneus at the insertion of the plantar fascia.  Plantar fascial brace intact.  Negative Tinel sign. No open lesions or pre-ulcerative lesions.  No pain with calf compression, swelling, warmth, erythema  Assessment: 38 year old female with continued left heel pain component fasciitis  Plan: -All treatment options discussed with the patient including all alternatives, risks, complications.  -Prescribed mobic. Discussed side effects of the medication and directed to stop if any are to occur and call the office.  -Discussed repeat steroid injection. -She was measured for orthotics.  This was completed today by Liliane Channel.  Discussed shoe modifications. -Continue stretching, icing daily as well. -Patient encouraged to call the office with any questions, concerns, change in symptoms.   Trula Slade DPM

## 2018-07-19 ENCOUNTER — Ambulatory Visit: Payer: BC Managed Care – PPO | Admitting: Orthotics

## 2018-07-19 ENCOUNTER — Other Ambulatory Visit: Payer: Self-pay

## 2018-07-19 DIAGNOSIS — M722 Plantar fascial fibromatosis: Secondary | ICD-10-CM

## 2018-07-19 NOTE — Progress Notes (Signed)
Patient came in today to pick up custom made foot orthotics.  The goals were accomplished and the patient reported no dissatisfaction with said orthotics.  Patient was advised of breakin period and how to report any issues. 

## 2018-08-23 ENCOUNTER — Other Ambulatory Visit: Payer: Self-pay

## 2018-08-23 ENCOUNTER — Ambulatory Visit (INDEPENDENT_AMBULATORY_CARE_PROVIDER_SITE_OTHER): Payer: BC Managed Care – PPO | Admitting: Family Medicine

## 2018-08-23 ENCOUNTER — Encounter: Payer: Self-pay | Admitting: Family Medicine

## 2018-08-23 DIAGNOSIS — K5901 Slow transit constipation: Secondary | ICD-10-CM

## 2018-08-23 DIAGNOSIS — K5900 Constipation, unspecified: Secondary | ICD-10-CM | POA: Insufficient documentation

## 2018-08-23 DIAGNOSIS — Z01419 Encounter for gynecological examination (general) (routine) without abnormal findings: Secondary | ICD-10-CM | POA: Diagnosis not present

## 2018-08-23 DIAGNOSIS — Z124 Encounter for screening for malignant neoplasm of cervix: Secondary | ICD-10-CM

## 2018-08-23 DIAGNOSIS — Z1151 Encounter for screening for human papillomavirus (HPV): Secondary | ICD-10-CM

## 2018-08-23 NOTE — Progress Notes (Signed)
Pt presents for annual; declines all STD testing.  GAD = 1

## 2018-08-23 NOTE — Patient Instructions (Signed)
 Preventive Care 21-39 Years Old, Female Preventive care refers to visits with your health care provider and lifestyle choices that can promote health and wellness. This includes:  A yearly physical exam. This may also be called an annual well check.  Regular dental visits and eye exams.  Immunizations.  Screening for certain conditions.  Healthy lifestyle choices, such as eating a healthy diet, getting regular exercise, not using drugs or products that contain nicotine and tobacco, and limiting alcohol use. What can I expect for my preventive care visit? Physical exam Your health care provider will check your:  Height and weight. This may be used to calculate body mass index (BMI), which tells if you are at a healthy weight.  Heart rate and blood pressure.  Skin for abnormal spots. Counseling Your health care provider may ask you questions about your:  Alcohol, tobacco, and drug use.  Emotional well-being.  Home and relationship well-being.  Sexual activity.  Eating habits.  Work and work environment.  Method of birth control.  Menstrual cycle.  Pregnancy history. What immunizations do I need?  Influenza (flu) vaccine  This is recommended every year. Tetanus, diphtheria, and pertussis (Tdap) vaccine  You may need a Td booster every 10 years. Varicella (chickenpox) vaccine  You may need this if you have not been vaccinated. Human papillomavirus (HPV) vaccine  If recommended by your health care provider, you may need three doses over 6 months. Measles, mumps, and rubella (MMR) vaccine  You may need at least one dose of MMR. You may also need a second dose. Meningococcal conjugate (MenACWY) vaccine  One dose is recommended if you are age 19-21 years and a first-year college student living in a residence hall, or if you have one of several medical conditions. You may also need additional booster doses. Pneumococcal conjugate (PCV13) vaccine  You may need  this if you have certain conditions and were not previously vaccinated. Pneumococcal polysaccharide (PPSV23) vaccine  You may need one or two doses if you smoke cigarettes or if you have certain conditions. Hepatitis A vaccine  You may need this if you have certain conditions or if you travel or work in places where you may be exposed to hepatitis A. Hepatitis B vaccine  You may need this if you have certain conditions or if you travel or work in places where you may be exposed to hepatitis B. Haemophilus influenzae type b (Hib) vaccine  You may need this if you have certain conditions. You may receive vaccines as individual doses or as more than one vaccine together in one shot (combination vaccines). Talk with your health care provider about the risks and benefits of combination vaccines. What tests do I need?  Blood tests  Lipid and cholesterol levels. These may be checked every 5 years starting at age 20.  Hepatitis C test.  Hepatitis B test. Screening  Diabetes screening. This is done by checking your blood sugar (glucose) after you have not eaten for a while (fasting).  Sexually transmitted disease (STD) testing.  BRCA-related cancer screening. This may be done if you have a family history of breast, ovarian, tubal, or peritoneal cancers.  Pelvic exam and Pap test. This may be done every 3 years starting at age 21. Starting at age 30, this may be done every 5 years if you have a Pap test in combination with an HPV test. Talk with your health care provider about your test results, treatment options, and if necessary, the need for more   tests. Follow these instructions at home: Eating and drinking   Eat a diet that includes fresh fruits and vegetables, whole grains, lean protein, and low-fat dairy.  Take vitamin and mineral supplements as recommended by your health care provider.  Do not drink alcohol if: ? Your health care provider tells you not to drink. ? You are  pregnant, may be pregnant, or are planning to become pregnant.  If you drink alcohol: ? Limit how much you have to 0-1 drink a day. ? Be aware of how much alcohol is in your drink. In the [place] one drink equals one 12 oz bottle of beer (355 mL), one 5 oz glass of wine (148 mL), or one 1 oz glass of hard liquor (44 mL). Lifestyle  Take daily care of your teeth and gums.  Stay active. Exercise for at least 30 minutes on 5 or more days each week.  Do not use any products that contain nicotine or tobacco, such as cigarettes, e-cigarettes, and chewing tobacco. If you need help quitting, ask your health care provider.  If you are sexually active, practice safe sex. Use a condom or other form of birth control (contraception) in order to prevent pregnancy and STIs (sexually transmitted infections). If you plan to become pregnant, see your health care provider for a preconception visit. What's next?  Visit your health care provider once a year for a well check visit.  Ask your health care provider how often you should have your eyes and teeth checked.  Stay up to date on all vaccines. This information is not intended to replace advice given to you by your health care provider. Make sure you discuss any questions you have with your health care provider. Document Released: 03/10/2001 Document Revised: 09/23/2017 Document Reviewed: 09/23/2017 Elsevier Patient Education  2020 [employer].

## 2018-08-23 NOTE — Assessment & Plan Note (Signed)
Increase fiber and water

## 2018-08-23 NOTE — Progress Notes (Signed)
  Subjective:     Robin Duncan is a 38 y.o. female and is here for a comprehensive physical exam. The patient reports problems - bleeding due to Nexplanon--placed following birth of last child, 1 year ago. Works for [employer] in [employer], fearful about job, during [employer]. Working on weight loss with Keto and exercise. C/o constipation--.taking stool softeners.  The following portions of the patient's history were reviewed and updated as appropriate: allergies, current medications, past family history, past medical history, past social history, past surgical history and problem list.  Review of Systems Pertinent items noted in HPI and remainder of comprehensive ROS otherwise negative.   Objective:    BP 130/86   Pulse 96   Temp 97.9 F (36.6 C)   Wt 233 lb (105.7 kg)   LMP 08/18/2018   BMI 42.62 kg/m  General appearance: alert, cooperative, appears stated age and moderately obese Head: Normocephalic, without obvious abnormality, atraumatic Neck: no adenopathy, supple, symmetrical, trachea midline and thyroid not enlarged, symmetric, no tenderness/mass/nodules Lungs: clear to auscultation bilaterally Breasts: normal appearance, no masses or tenderness Heart: regular rate and rhythm, S1, S2 normal, no murmur, click, rub or gallop Abdomen: soft, non-tender; bowel sounds normal; no masses,  no organomegaly Pelvic: cervix normal in appearance, external genitalia normal, no adnexal masses or tenderness, no cervical motion tenderness, rectovaginal septum normal and uterus normal size, shape, and consistency Extremities: extremities normal, atraumatic, no cyanosis or edema Pulses: 2+ and symmetric Skin: Skin color, texture, turgor normal. No rashes or lesions Lymph nodes: Cervical, supraclavicular, and axillary nodes normal. Neurologic: Grossly normal    Assessment:    Healthy female exam.      Plan:      Problem List Items Addressed This Visit      Unprioritized   Constipation   Increase fiber and water.       Other Visit Diagnoses    Screening for malignant neoplasm of cervix       Relevant Orders   Cytology - PAP( [facility])   Encounter for gynecological examination without abnormal finding         Consider mammogram next year vs. Waiting (no FH)  Return in 1 year (on 08/23/2019).  See After Visit Summary for Counseling Recommendations

## 2018-08-25 LAB — CYTOLOGY - PAP
Diagnosis: NEGATIVE
HPV: NOT DETECTED

## 2018-09-01 ENCOUNTER — Ambulatory Visit (HOSPITAL_COMMUNITY)
Admission: EM | Admit: 2018-09-01 | Discharge: 2018-09-01 | Disposition: A | Discharge status: Home / Self Care | Payer: BC Managed Care – PPO | Attending: Urgent Care | Admitting: Urgent Care

## 2018-09-01 ENCOUNTER — Other Ambulatory Visit: Payer: Self-pay

## 2018-09-01 DIAGNOSIS — Z20828 Contact with and (suspected) exposure to other viral communicable diseases: Secondary | ICD-10-CM | POA: Diagnosis not present

## 2018-09-01 NOTE — ED Triage Notes (Signed)
Patient would like COVID testing. States she went to the beach for vacation and states that she wants to be sure she isn't sick now because the [place] is a hot spot. Patient states she was extra careful.   Denies any symptoms.

## 2018-09-01 NOTE — Discharge Instructions (Signed)
Please see instructions on mychart signup. We will call you if there are positive results.

## 2018-09-03 LAB — NOVEL CORONAVIRUS, NAA (HOSP ORDER, SEND-OUT TO REF LAB; TAT 18-24 HRS): SARS-CoV-2, NAA: NOT DETECTED

## 2018-09-05 ENCOUNTER — Encounter (HOSPITAL_COMMUNITY): Payer: Self-pay

## 2018-09-21 ENCOUNTER — Telehealth: Payer: Self-pay | Admitting: Podiatry

## 2018-09-21 MED ORDER — MELOXICAM 15 MG PO TABS: 15.0000 mg | ORAL_TABLET | Freq: Every day | ORAL | 0 refills | Status: DC

## 2018-09-21 NOTE — Addendum Note (Signed)
Addended by: Harriett Sine D on: 09/21/2018 02:19 PM   Modules accepted: Orders

## 2018-09-21 NOTE — Telephone Encounter (Signed)
Pt called requesting a refill of meloxicam. Please send to [facility] [address]

## 2018-09-21 NOTE — Telephone Encounter (Signed)
Dr. Berton Lan the refill Meloxicam and stated pt needs an appt prior to future refills. I informed pt of the refill and Dr. Leigh Aurora orders.

## 2019-03-07 ENCOUNTER — Encounter (HOSPITAL_COMMUNITY): Payer: Self-pay

## 2019-03-07 ENCOUNTER — Other Ambulatory Visit: Payer: Self-pay

## 2019-03-07 ENCOUNTER — Ambulatory Visit (HOSPITAL_COMMUNITY)
Admission: EM | Admit: 2019-03-07 | Discharge: 2019-03-07 | Disposition: A | Discharge status: Home / Self Care | Payer: BC Managed Care – PPO | Attending: Emergency Medicine | Admitting: Emergency Medicine

## 2019-03-07 DIAGNOSIS — Z20822 Contact with and (suspected) exposure to covid-19: Secondary | ICD-10-CM | POA: Diagnosis present

## 2019-03-07 DIAGNOSIS — J069 Acute upper respiratory infection, unspecified: Secondary | ICD-10-CM | POA: Diagnosis present

## 2019-03-07 MED ORDER — IBUPROFEN 600 MG PO TABS: 600.0000 mg | ORAL_TABLET | Freq: Four times a day (QID) | ORAL | 0 refills | Status: DC | PRN

## 2019-03-07 NOTE — ED Triage Notes (Signed)
Patient presents to Urgent Care with complaints of covid exposure since her 48 month old tested positive for covid yesterday. Patient reports she has been feeling badly for several days.

## 2019-03-07 NOTE — Discharge Instructions (Signed)
Covid swab pending Quarantine for full 10 days as well as until symptoms improving and fever free Continue over-the-counter cold and flu medicine for congestion Tylenol and ibuprofen for body aches and any fevers May try using vitamin C and zinc  Please follow-up if not improving or worsening, developing difficulty breathing, chest pain

## 2019-03-07 NOTE — ED Provider Notes (Signed)
[facility]    CSN: [id number] Arrival date & time: 03/07/19  0807      History   Chief Complaint Chief Complaint  Patient presents with  . Covid Exposure    HPI Robin Duncan is a 39 y.o. female no contributing past medical history presenting today for evaluation of nasal congestion, body aches and Covid exposure.  Patient states that she began to feel under the weather on Friday, symptoms have persisted over the past 4 days.  States feels slightly improved today.  Reports a lot of body aches, fatigue, nasal congestion and loss of smell and taste.  Her son also has been sick and recently tested positive for Covid.  She denies any chest pain or cough.  Denies any fevers or chills.  HPI  Past Medical History:  Diagnosis Date  . Bursitis   . Pregnancy induced hypertension   . Sickle cell trait ([place])   . Vaginal Pap smear, abnormal     Patient Active Problem List   Diagnosis Date Noted  . Constipation 08/23/2018  . Plantar fasciitis 05/26/2018  . Sickle cell trait ([place]) 02/18/2017  . Vitamin D deficiency 02/18/2017  . History of pregnancy induced hypertension 02/15/2017    Past Surgical History:  Procedure Laterality Date  . leep      OB History    Gravida  4   Para  3   Term  3   Preterm  0   AB  1   Living  3     SAB      TAB      Ectopic      Multiple  0   Live Births  1            Home Medications    Prior to Admission medications   Medication Sig Start Date End Date Taking? Authorizing Provider  etonogestrel (NEXPLANON) 68 MG IMPL implant 1 each by Subdermal route once. 10/14/17 10/14/20  [provider]  ibuprofen (ADVIL) 600 MG tablet Take 1 tablet (600 mg total) by mouth every 6 (six) hours as needed. 03/07/19   Ailie Gage C, PA-C  meloxicam (MOBIC) 15 MG tablet Take 1 tablet (15 mg total) by mouth daily. 09/21/18 09/21/19  Trula Slade, DPM  Multiple Vitamin (MULTIVITAMIN) capsule Take 1 capsule by  mouth daily.    [provider]  naproxen sodium (ALEVE) 220 MG tablet Take 220 mg by mouth.    [provider]    Family History Family History  Problem Relation Age of Onset  . Diabetes Mother   . Hypertension Father     Social History Social History   Tobacco Use  . Smoking status: Former Smoker    Packs/day: 0.10    Types: Cigarettes    Quit date: 09/07/2015    Years since quitting: 3.4  . Smokeless tobacco: Never Used  Substance Use Topics  . Alcohol use: Yes    Alcohol/week: 0.0 standard drinks    Comment: socially   . Drug use: No     Allergies   Patient has no known allergies.   Review of Systems Review of Systems  Constitutional: Positive for fatigue. Negative for activity change, appetite change, chills and fever.  HENT: Positive for congestion, rhinorrhea and sore throat. Negative for ear pain, sinus pressure and trouble swallowing.   Eyes: Negative for discharge and redness.  Respiratory: Positive for shortness of breath. Negative for cough and chest tightness.   Cardiovascular: Negative for  chest pain.  Gastrointestinal: Negative for abdominal pain, diarrhea, nausea and vomiting.  Musculoskeletal: Positive for myalgias.  Skin: Negative for rash.  Neurological: Positive for headaches. Negative for dizziness and light-headedness.     Physical Exam Triage Vital Signs ED Triage Vitals [03/07/19 0828]  Enc Vitals Group     BP      Pulse      Resp      Temp      Temp src      SpO2      Weight      Height      Head Circumference      Peak Flow      Pain Score 0     Pain Loc      Pain Edu?      Excl. in GC?    No data found.  Updated Vital Signs BP 115/88 (BP Location: Right Arm)   Pulse 90   Temp 98.2 F (36.8 C) (Oral)   Resp 16   SpO2 99%   Visual Acuity Right Eye Distance:   Left Eye Distance:   Bilateral Distance:    Right Eye Near:   Left Eye Near:    Bilateral Near:     Physical Exam Vitals and nursing  note reviewed.  Constitutional:      Appearance: She is well-developed.     Comments: No acute distress  HENT:     Head: Normocephalic and atraumatic.     Ears:     Comments: Bilateral ears without tenderness to palpation of external auricle, tragus and mastoid, EAC's without erythema or swelling, TM's with good bony landmarks and cone of light. Non erythematous.     Nose: Nose normal.     Mouth/Throat:     Comments: Oral mucosa pink and moist, no tonsillar enlargement or exudate. Posterior pharynx patent and nonerythematous, no uvula deviation or swelling. Normal phonation. Eyes:     Conjunctiva/sclera: Conjunctivae normal.  Cardiovascular:     Rate and Rhythm: Normal rate.  Pulmonary:     Effort: Pulmonary effort is normal. No respiratory distress.     Comments: Breathing comfortably at rest, CTABL, no wheezing, rales or other adventitious sounds auscultated Abdominal:     General: There is no distension.  Musculoskeletal:        General: Normal range of motion.     Cervical back: Neck supple.  Skin:    General: Skin is warm and dry.  Neurological:     Mental Status: She is alert and oriented to person, place, and time.      UC Treatments / Results  Labs (all labs ordered are listed, but only abnormal results are displayed) Labs Reviewed  NOVEL CORONAVIRUS, NAA (HOSP ORDER, SEND-OUT TO REF LAB; TAT 18-24 HRS)    EKG   Radiology No results found.  Procedures Procedures (including critical care time)  Medications Ordered in UC Medications - No data to display  Initial Impression / Assessment and Plan / UC Course  I have reviewed the triage vital signs and the nursing notes.  Pertinent labs & imaging results that were available during my care of the patient were reviewed by me and considered in my medical decision making (see chart for details).     URI symptoms x4 days, Covid exposure at home.  Likely Covid.  PCR pending.  Quarantine for full 10 days from  symptom onset until symptoms improving and fever free.  Symptomatic and supportive care.  Rest and fluids.  Vital signs stable, lungs clear, no respiratory distress.  Discussed strict return precautions. Patient verbalized understanding and is agreeable with plan.  Final Clinical Impressions(s) / UC Diagnoses   Final diagnoses:  Viral URI  Suspected COVID-19 virus infection     Discharge Instructions     Covid swab pending Quarantine for full 10 days as well as until symptoms improving and fever free Continue over-the-counter cold and flu medicine for congestion Tylenol and ibuprofen for body aches and any fevers May try using vitamin C and zinc  Please follow-up if not improving or worsening, developing difficulty breathing, chest pain    ED Prescriptions    Medication Sig Dispense Auth. Provider   ibuprofen (ADVIL) 600 MG tablet Take 1 tablet (600 mg total) by mouth every 6 (six) hours as needed. 30 tablet Delina Kruczek, [place] C, PA-C     PDMP not reviewed this encounter.   Lew Dawes, [place] 03/07/19 [phone]

## 2019-03-09 LAB — NOVEL CORONAVIRUS, NAA (HOSP ORDER, SEND-OUT TO REF LAB; TAT 18-24 HRS): SARS-CoV-2, NAA: NOT DETECTED

## 2019-07-13 IMAGING — US US OB COMP LESS 14 WK
1 series · 15 of 24 positions shown · non-contrast
Comparison: None.

CLINICAL DATA: Pregnant patient with uncertain last menstrual
period. Evaluate dates.

EXAM:
OBSTETRIC <14 WK ULTRASOUND
TECHNIQUE: Transabdominal ultrasound was performed for evaluation of the
gestation as well as the maternal uterus and adnexal regions.

[Series 1: us ob comp less 14 wk · 15 of 24 slices shown]
[im 1/24]
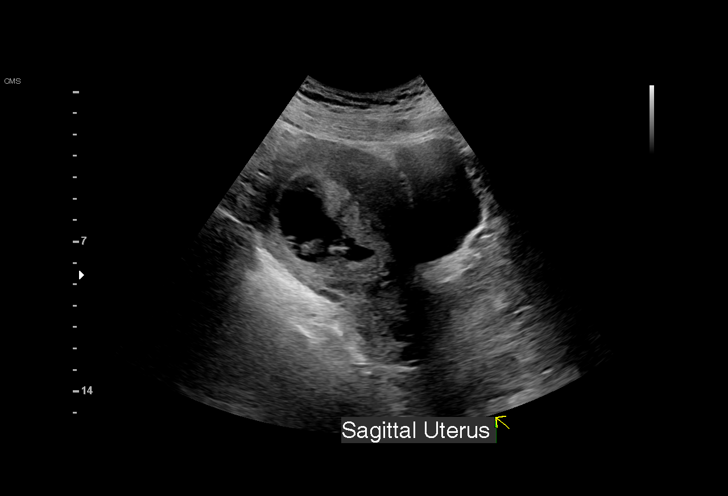
[im 3/24]
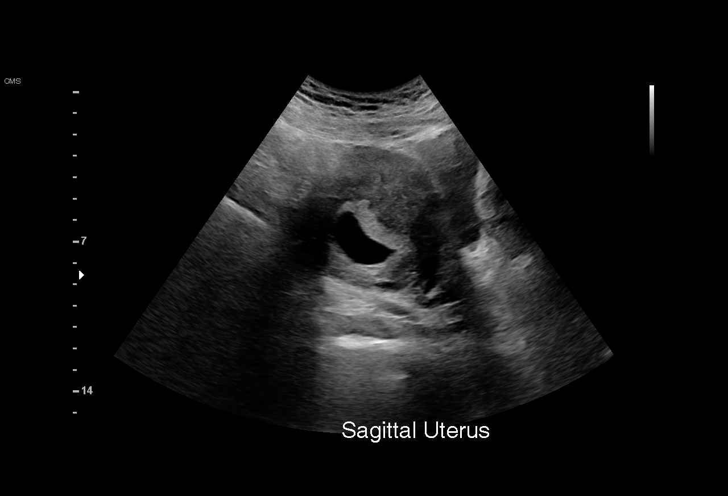
[im 5/24]
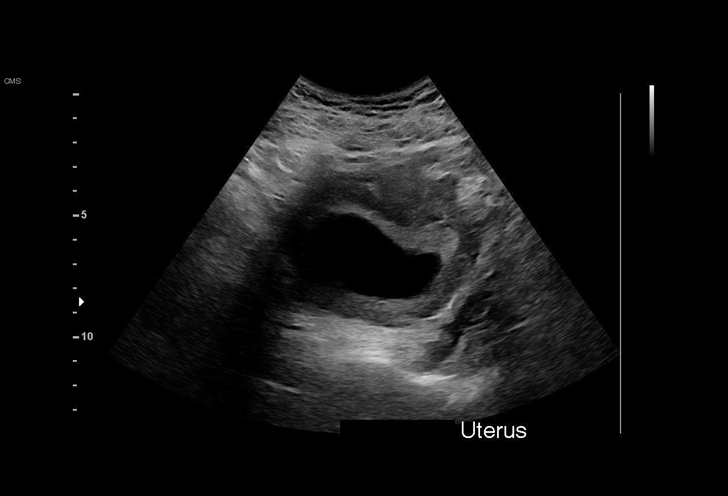
[im 6/24]
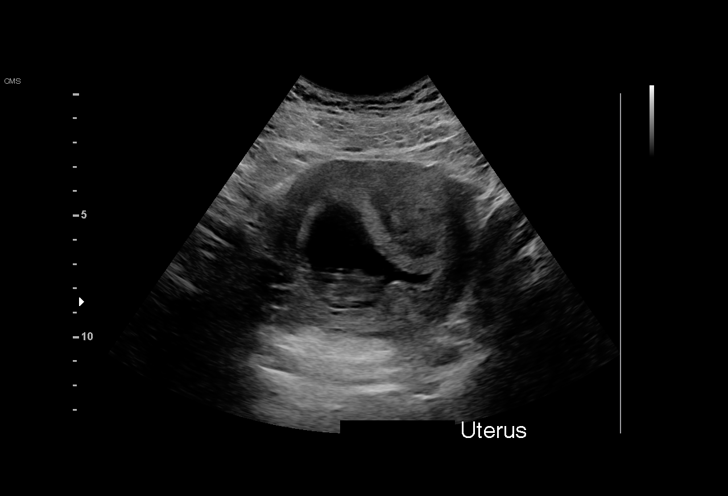
[im 8/24]
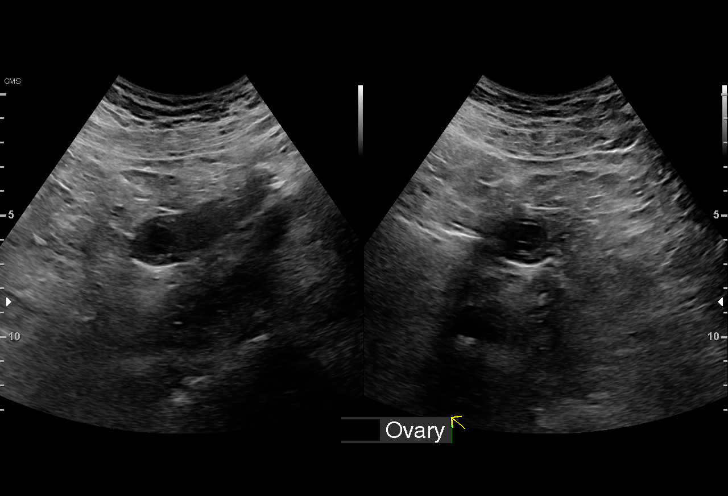
[im 9/24]
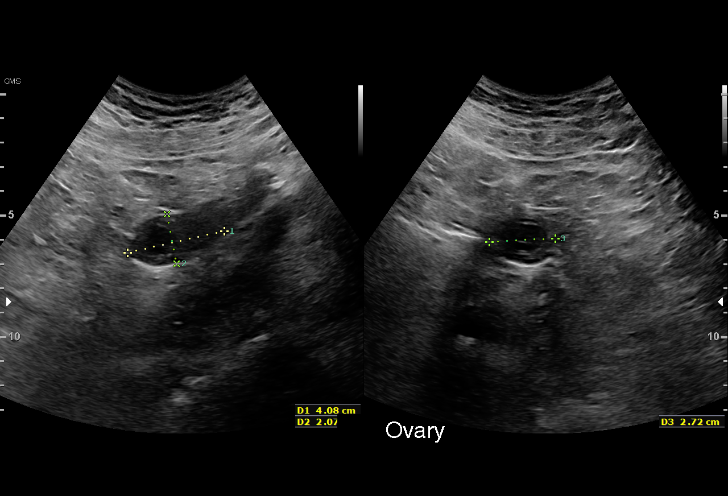
[im 11/24]
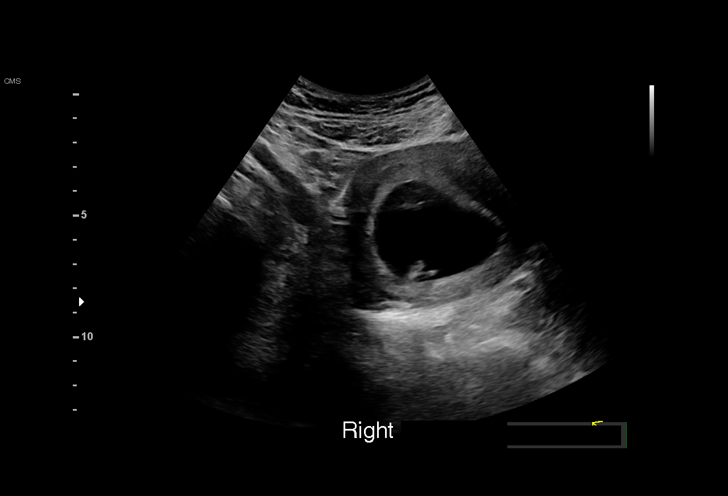
[im 13/24]
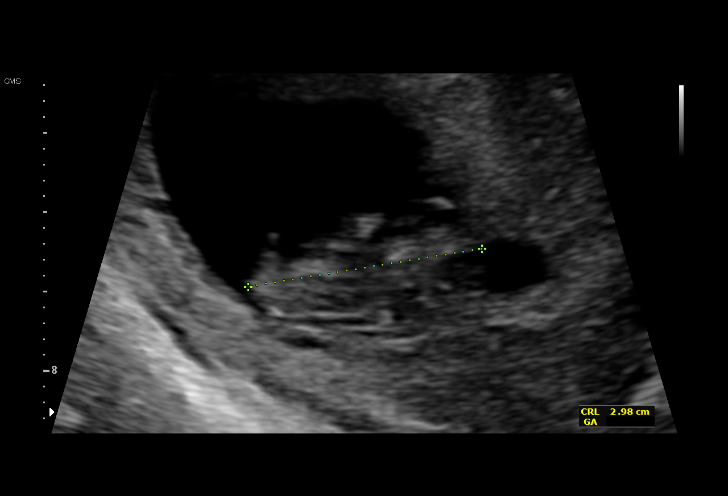
[im 14/24]
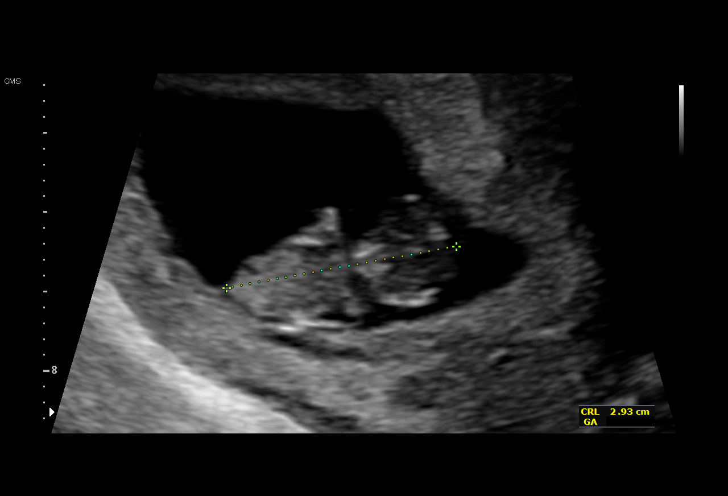
[im 16/24]
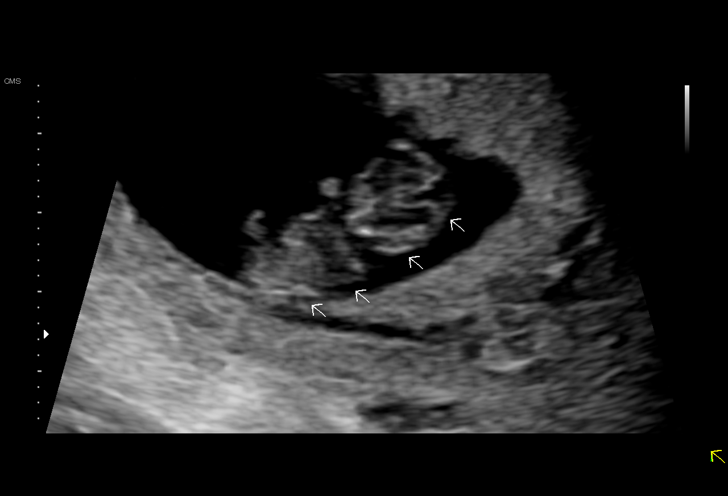
[im 17/24]
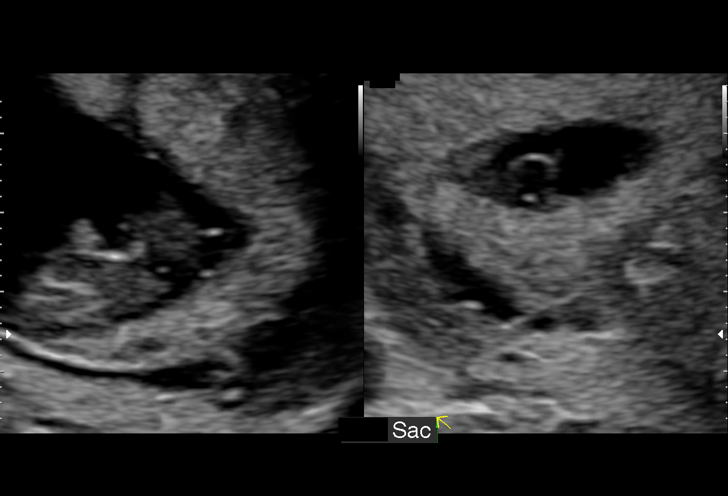
[im 19/24]
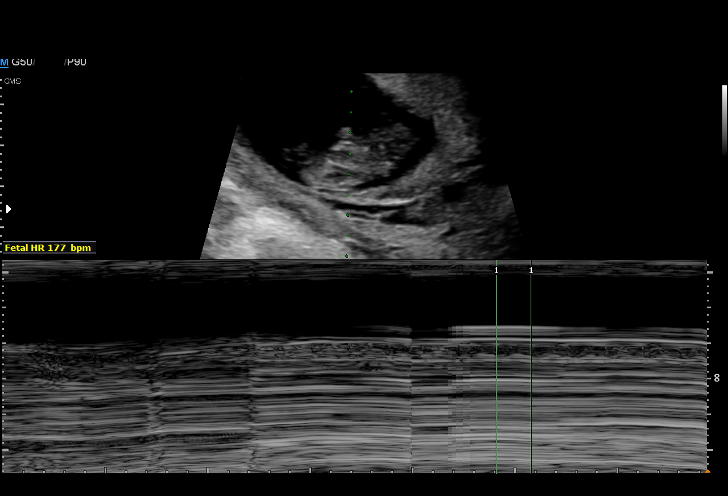
[im 21/24]
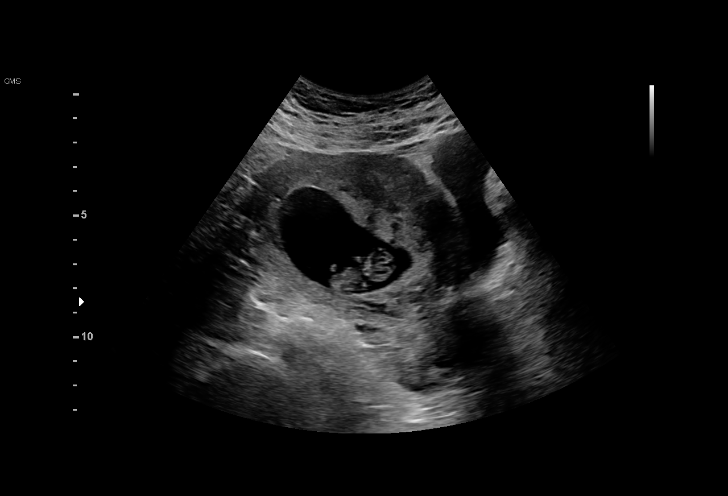
[im 22/24]
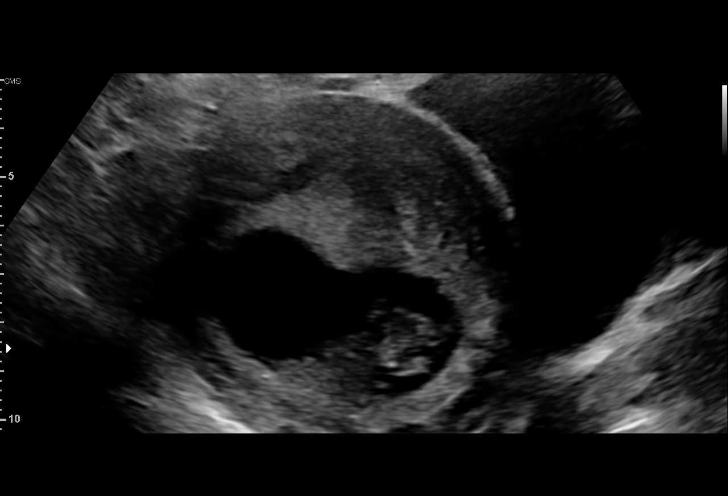
[im 24/24]
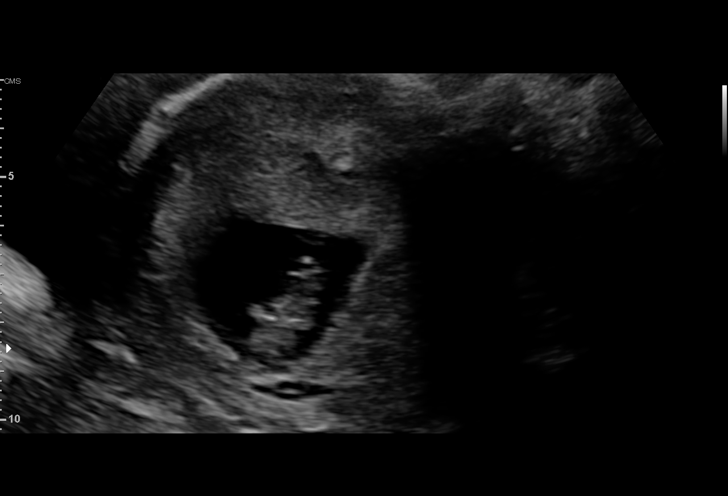

[15 of 24 positions shown; findings below may reference images not displayed]

FINDINGS: Intrauterine gestational sac: Single

Yolk sac:  Visualized.

Embryo:  Visualized.

Cardiac Activity: Visualized.

Heart Rate: 171 bpm

MSD:   mm    w     d

CRL:   29.8 mm   9 w 5 d                  US EDC: September 25, 2017

Subchorionic hemorrhage:  None visualized.

Maternal uterus/adnexae: The right ovary was not visualized. The
left ovary is normal. No free fluid.
IMPRESSION: Single live IUP.

## 2019-08-24 ENCOUNTER — Other Ambulatory Visit (HOSPITAL_COMMUNITY)
Admission: RE | Admit: 2019-08-24 | Discharge: 2019-08-24 | Disposition: A | Discharge status: Home / Self Care | Payer: BC Managed Care – PPO | Source: Ambulatory Visit

## 2019-08-24 ENCOUNTER — Other Ambulatory Visit: Payer: Self-pay

## 2019-08-24 ENCOUNTER — Ambulatory Visit (INDEPENDENT_AMBULATORY_CARE_PROVIDER_SITE_OTHER): Payer: BC Managed Care – PPO

## 2019-08-24 VITALS — BP 133/88 | HR 83 | Ht 62.0 in | Wt 228.4 lb

## 2019-08-24 DIAGNOSIS — Z113 Encounter for screening for infections with a predominantly sexual mode of transmission: Secondary | ICD-10-CM | POA: Diagnosis present

## 2019-08-24 DIAGNOSIS — Z0000 Encounter for general adult medical examination without abnormal findings: Secondary | ICD-10-CM

## 2019-08-24 DIAGNOSIS — N760 Acute vaginitis: Secondary | ICD-10-CM

## 2019-08-24 DIAGNOSIS — B9689 Other specified bacterial agents as the cause of diseases classified elsewhere: Secondary | ICD-10-CM

## 2019-08-24 DIAGNOSIS — R030 Elevated blood-pressure reading, without diagnosis of hypertension: Secondary | ICD-10-CM

## 2019-08-24 DIAGNOSIS — Z1239 Encounter for other screening for malignant neoplasm of breast: Secondary | ICD-10-CM | POA: Diagnosis not present
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
## 2019-08-24 NOTE — Progress Notes (Signed)
Patient presents for AEX. Pt would like STD testing. She has no concerns today.  Last pap 08/23/2018 Normal

## 2019-08-24 NOTE — Patient Instructions (Addendum)
Laparoscopic Tubal Ligation Laparoscopic tubal ligation is a procedure to close the fallopian tubes. This is done so that you cannot get pregnant. When the fallopian tubes are closed, the eggs that your ovaries release cannot enter the uterus, and sperm cannot reach the released eggs. You should not have this procedure if you want to get pregnant someday or if you are unsure about having more children. Tell a health care provider about:  Any allergies you have.  All medicines you are taking, including vitamins, herbs, eye drops, creams, and over-the-counter medicines.  Any problems you or family members have had with anesthetic medicines.  Any blood disorders you have.  Any surgeries you have had.  Any medical conditions you have.  Whether you are pregnant or may be pregnant.  Any past pregnancies. What are the risks? Generally, this is a safe procedure. However, problems may occur, including:  Infection.  Bleeding.  Injury to other organs in the abdomen.  Side effects from anesthetic medicines.  Failure of the procedure. This procedure can increase your risk of a kind of pregnancy in which a fertilized egg attaches to the outside of the uterus (ectopic pregnancy). What happens before the procedure? Medicines  Ask your health care provider about: ? Changing or stopping your regular medicines. This is especially important if you are taking diabetes medicines or blood thinners. ? Taking medicines such as aspirin and ibuprofen. These medicines can thin your blood. Do not take these medicines unless your health care provider tells you to take them. ? Taking over-the-counter medicines, vitamins, herbs, and supplements. Staying hydrated  Follow instructions from your health care provider about hydration, which may include: ? Up to 2 hours before the procedure - you may continue to drink clear liquids, such as water, clear fruit juice, black coffee, and plain tea. Eating and  drinking  Follow instructions from your health care provider about eating and drinking, which may include: ? 8 hours before the procedure - stop eating heavy meals or foods, such as meat, fried foods, or fatty foods. ? 6 hours before the procedure - stop eating light meals or foods, such as toast or cereal. ? 6 hours before the procedure - stop drinking milk or drinks that contain milk. ? 2 hours before the procedure - stop drinking clear liquids. General instructions  Do not use any products that contain nicotine or tobacco for at least 4 weeks before the procedure. These products include cigarettes, e-cigarettes, and chewing tobacco. If you need help quitting, ask your health care provider.  Plan to have someone take you home from the hospital.  If you will be going home right after the procedure, plan to have someone with you for 24 hours.  Ask your health care provider: ? How your surgery site will be marked. ? What steps will be taken to help prevent infection. These may include:  Removing hair at the surgery site.  Washing skin with a germ-killing soap.  Taking antibiotic medicine. What happens during the procedure?      An IV will be inserted into one of your veins.  You will be given one or more of the following: ? A medicine to help you relax (sedative). ? A medicine to numb the area (local anesthetic). ? A medicine to make you fall asleep (general anesthetic). ? A medicine that is injected into an area of your body to numb everything below the injection site (regional anesthetic).  Your bladder may be emptied with a small tube (  catheter).  If you have been given a general anesthetic, a tube will be put down your throat to help you breathe.  Two small incisions will be made in your lower abdomen and near your belly button.  Your abdomen will be inflated with a gas. This will let the surgeon see better and will give the surgeon room to work.  A thin, lighted tube  (laparoscope) with a camera attached will be inserted into your abdomen through one of the incisions. Small instruments will be inserted through the other incision.  The fallopian tubes will be tied off, burned (cauterized), or blocked with a clip, ring, or clamp. A small portion in the center of each fallopian tube may be removed.  The gas will be released from the abdomen.  The incisions will be closed with stitches (sutures).  A bandage (dressing) will be placed over the incisions. The procedure may vary among health care providers and hospitals. What happens after the procedure?  Your blood pressure, heart rate, breathing rate, and blood oxygen level will be monitored until you leave the hospital.  You will be given medicine to help with pain, nausea, and vomiting as needed. Summary  Laparoscopic tubal ligation is a procedure that is done so that you cannot get pregnant.  You should not have this procedure if you want to get pregnant someday or if you are unsure about having more children.  The procedure is done using a thin, lighted tube (laparoscope) with a camera attached that will be inserted into your abdomen through an incision.  Follow instructions from your health care provider about eating and drinking before the procedure. This information is not intended to replace advice given to you by your health care provider. Make sure you discuss any questions you have with your health care provider. Document Revised: 06/21/2018 Document Reviewed: 12/07/2017 Elsevier Patient Education  2020 [employer].  Protein Content in Foods  Generally, most healthy people need around 50 grams of protein each day. Depending on your overall health, you may need more or less protein in your diet. Talk to your health care provider or dietitian about how much protein you need. See the following list for the protein content of some common foods. High-protein foods High-protein foods contain 4  grams (4 g) or more of protein per serving. They include: Beef, ground sirloin (cooked) -- 3 oz have 24 g of protein. Cheese (hard) -- 1 oz has 7 g of protein. Chicken breast, boneless and skinless (cooked) -- 3 oz have 13.4 g of protein. Cottage cheese -- 1/2 cup has 13.4 g of protein. Egg -- 1 egg has 6 g of protein. Fish, filet (cooked) -- 1 oz has 6-7 g of protein. Garbanzo beans (canned or cooked) -- 1/2 cup has 6-7 g of protein. Kidney beans (canned or cooked) -- 1/2 cup has 6-7 g of protein. Lamb (cooked) -- 3 oz has 24 g of protein. Milk -- 1 cup (8 oz) has 8 g of protein. Nuts (peanuts, pistachios, almonds) -- 1 oz has 6 g of protein. Peanut butter -- 1 oz has 7-8 g of protein. Pork tenderloin (cooked) -- 3 oz has 18.4 g of protein. Pumpkin seeds -- 1 oz has 8.5 g of protein. Soybeans (roasted) -- 1 oz has 8 g of protein. Soybeans (cooked) -- 1/2 cup has 11 g of protein. Soy milk -- 1 cup (8 oz) has 5-10 g of protein. Soy or vegetable patty -- 1 patty has 11 g of  protein. Sunflower seeds -- 1 oz has 5.5 g of protein. Tofu (firm) -- 1/2 cup has 20 g of protein. Tuna (canned in water) -- 3 oz has 20 g of protein. Yogurt -- 6 oz has 8 g of protein. Low-protein foods Low-protein foods contain 3 grams (3 g) or less of protein per serving. They include: Beets (raw or cooked) -- 1/2 cup has 1.5 g of protein. Bran cereal -- 1/2 cup has 2-3 g of protein. Bread -- 1 slice has 2.5 g of protein. Broccoli (raw or cooked) -- 1/2 cup has 2 g of protein. Collard greens (raw or cooked) -- 1/2 cup has 2 g of protein. Corn (fresh or cooked) -- 1/2 cup has 2 g of protein. Cream cheese -- 1 oz has 2 g of protein. Creamer (half-and-half) -- 1 oz has 1 g of protein. Flour tortilla -- 1 tortilla has 2.5 g of protein Frozen yogurt -- 1/2 cup has 3 g of protein. Fruit or vegetable juice -- 1/2 cup has 1 g of protein. Green beans (raw or cooked) -- 1/2 cup has 1 g of protein. Green peas (canned)  -- 1/2 cup has 3.5 g of protein. Muffins -- 1 small muffin (2 oz) has 3 g of protein. Oatmeal (cooked) -- 1/2 cup has 3 g of protein. Potato (baked with skin) -- 1 medium potato has 3 g of protein. Rice (cooked) -- 1/2 cup has 2.5-3.5 g of protein. Sour cream -- 1/2 cup has 2.5 g of protein. Spinach (cooked) -- 1/2 cup has 3 g of protein. Squash (cooked) -- 1/2 cup has 1.5 g of protein. Actual amounts of protein may be different depending on processing. Talk with your health care provider or dietitian about what foods are recommended for you. This information is not intended to replace advice given to you by your health care provider. Make sure you discuss any questions you have with your health care provider. Document Revised: 09/23/2015 Document Reviewed: 09/23/2015 Elsevier Patient Education  2020 [employer].

## 2019-08-25 LAB — CERVICOVAGINAL ANCILLARY ONLY
Bacterial Vaginitis (gardnerella): POSITIVE — AB
Candida Glabrata: NEGATIVE
Candida Vaginitis: NEGATIVE
Chlamydia: NEGATIVE
Comment: NEGATIVE
Comment: NEGATIVE
Comment: NEGATIVE
Comment: NEGATIVE
Comment: NEGATIVE
Comment: NORMAL
Neisseria Gonorrhea: NEGATIVE
Trichomonas: NEGATIVE

## 2019-08-29 MED ORDER — METRONIDAZOLE 500 MG PO TABS: 500.0000 mg | ORAL_TABLET | Freq: Two times a day (BID) | ORAL | 0 refills | Status: DC

## 2019-08-29 NOTE — Addendum Note (Signed)
Addended by: Gerrit Heck L on: 08/29/2019 01:43 PM   Modules accepted: Orders

## 2019-09-14 IMAGING — US US MFM OB DETAIL+14 WK
1 series · 13 of 28 positions shown · non-contrast
Comparison: none

[Series 1: us mfm ob detail+14 wk · 13 of 95 slices shown]
[im 4/95]
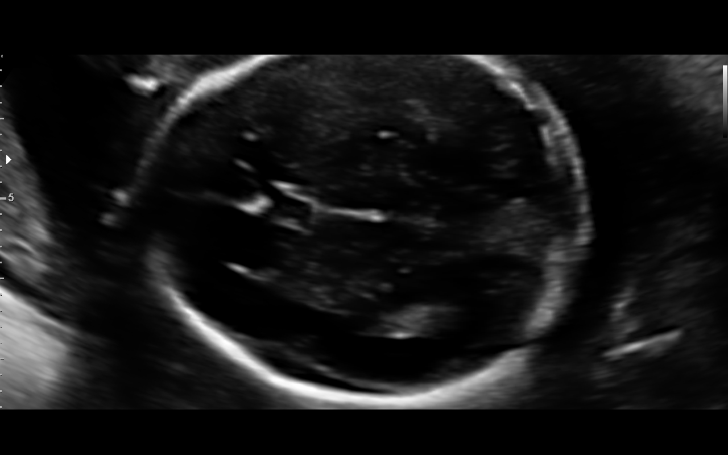
[im 11/95]
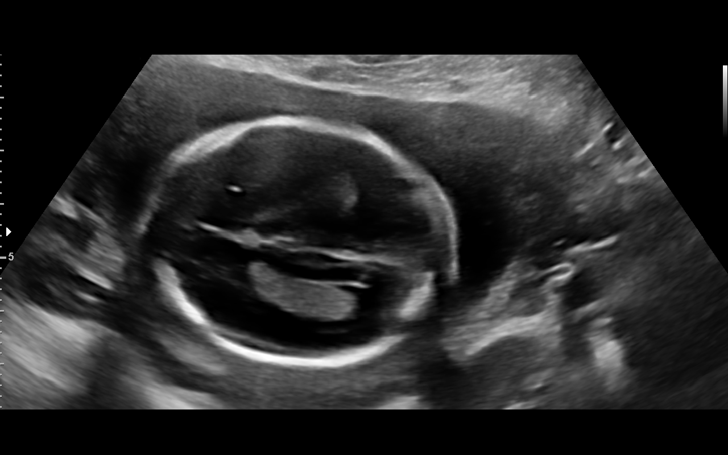
[im 18/95]
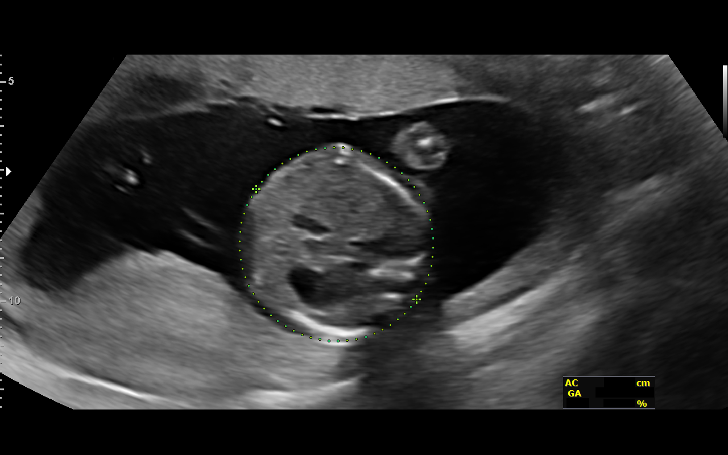
[im 25/95]
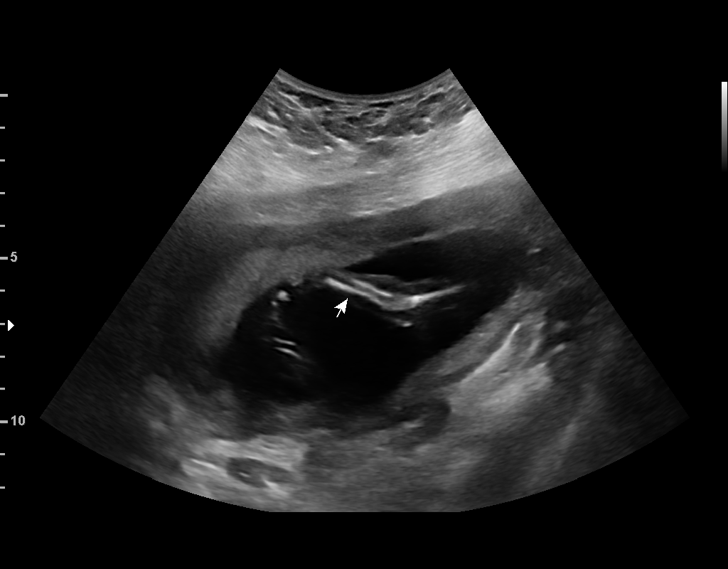
[im 32/95]
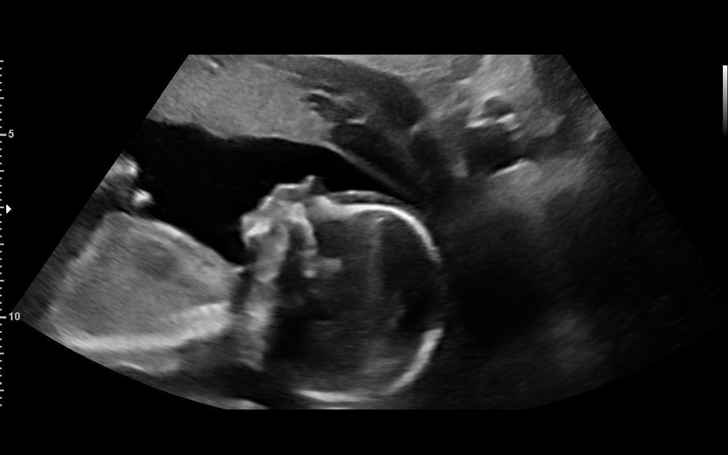
[im 39/95]
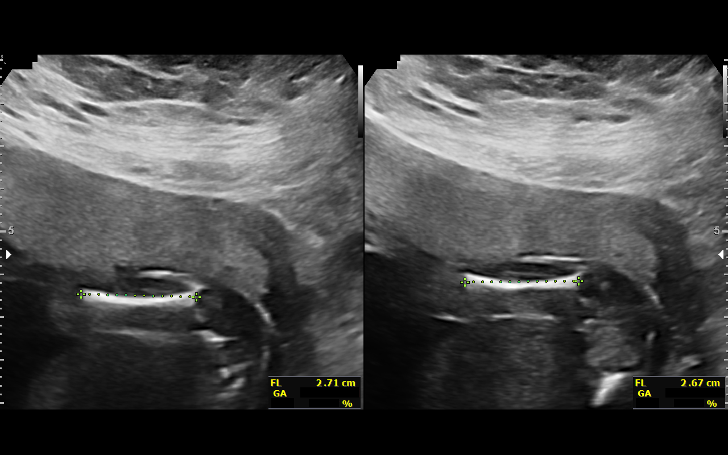
[im 49/95]
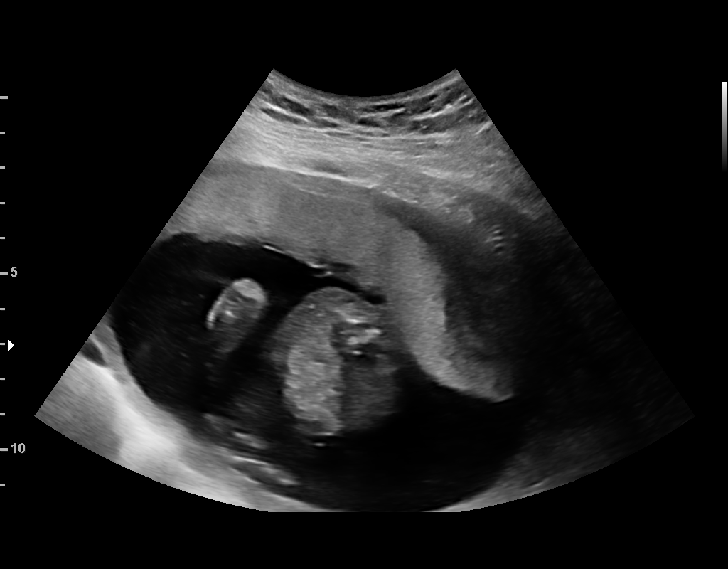
[im 56/95]
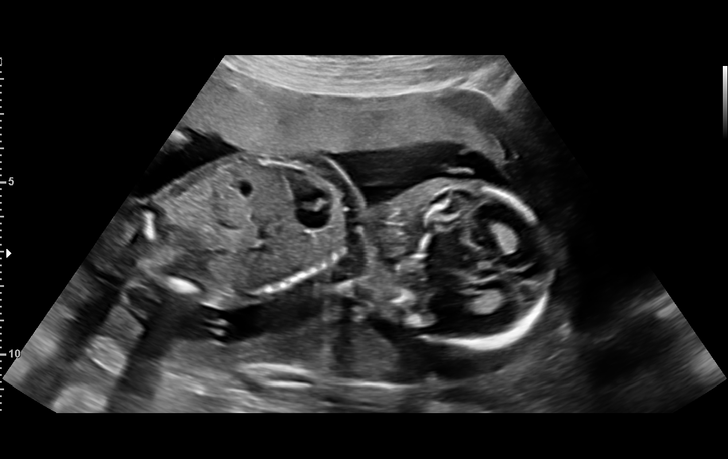
[im 63/95]
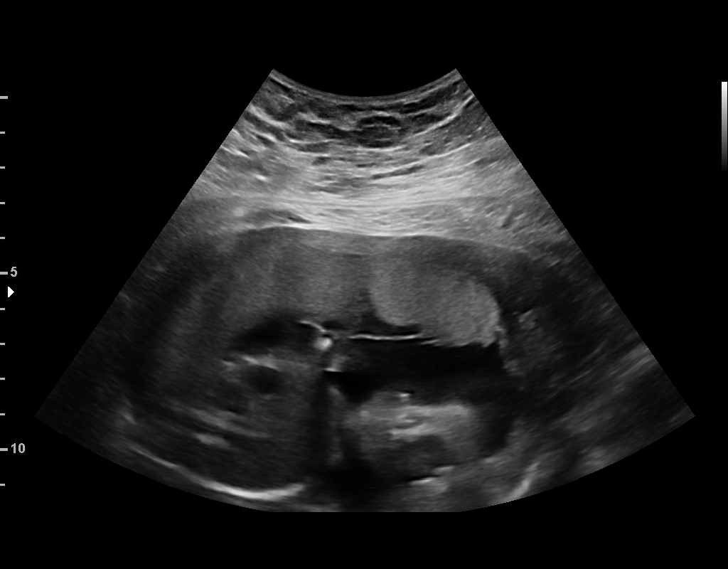
[im 70/95]
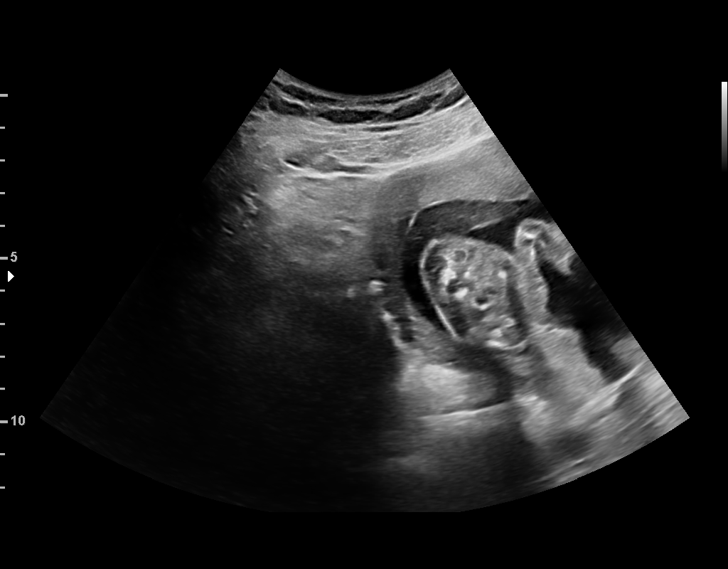
[im 77/95]
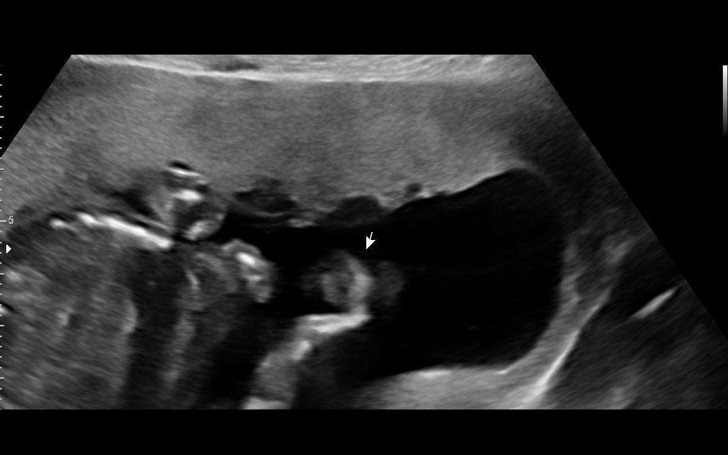
[im 84/95]
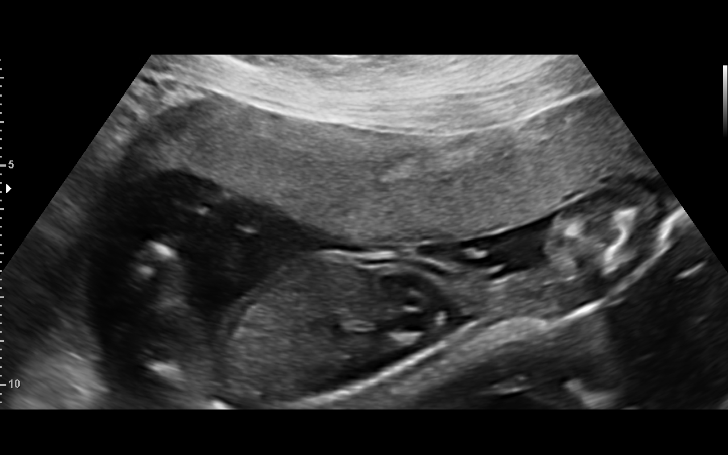
[im 91/95]
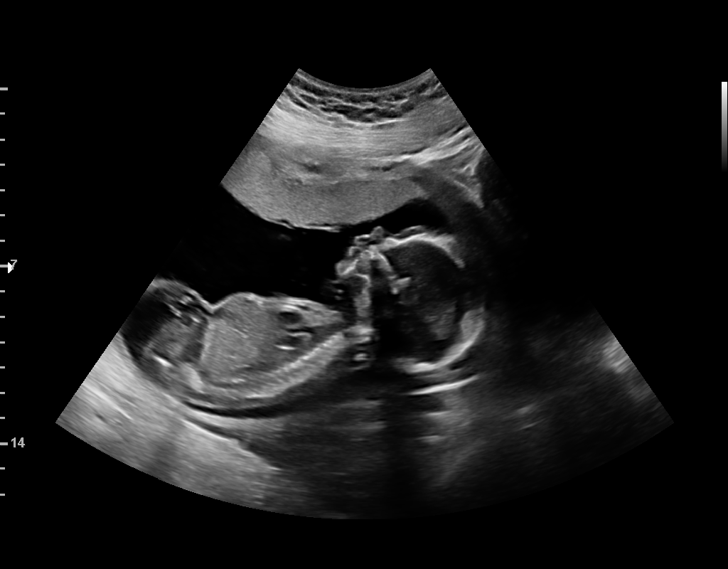

[13 of 28 positions shown; findings below may reference images not displayed]

Road [HOSPITAL]

Indications

18 weeks gestation of pregnancy
Obesity complicating pregnancy, second
trimester (Pre Preg BMI 36)
Advanced maternal age multigravida 35+,
second trimester (Low Risk NIPS)
Poor obstetric history: Previous
preeclampsia / eclampsia/gestational HTN
History of sickle cell trait
Encounter for fetal anatomic survey
OB History

Blood Type:            Height:  5'4"   Weight (lb):  236       BMI:
Gravidity:    4         Term:   2         SAB:   1
Living:       2
Fetal Evaluation

Num Of Fetuses:     1
Fetal Heart         141
Rate(bpm):
Cardiac Activity:   Observed
Presentation:       Cephalic
Placenta:           Anterior, above cervical os
P. Cord Insertion:  Visualized

Amniotic Fluid
AFI FV:      Subjectively within normal limits
Biometry
BPD:      45.8  mm     G. Age:  19w 6d         90  %    CI:        78.85   %    70 - 86
FL/HC:      16.9   %    16.1 -
HC:      163.1  mm     G. Age:  19w 1d         60  %    HC/AC:      1.18        1.09 -
AC:      138.8  mm     G. Age:  19w 2d         65  %    FL/BPD:     60.3   %
FL:       27.6  mm     G. Age:  18w 3d         34  %    FL/AC:      19.9   %    20 - 24
HUM:      26.8  mm     G. Age:  18w 4d         46  %
CER:      19.3  mm     G. Age:  18w 5d         48  %
NFT:       5.3  mm
CM:        4.8  mm

Est. FW:     265  gm      0 lb 9 oz     50  %
Gestational Age

LMP:           22w 6d        Date:  11/20/16                 EDD:   08/27/17
U/S Today:     19w 1d                                        EDD:   09/22/17
Best:          18w 5d     Det. By:  Early Ultrasound         EDD:   09/25/17
(02/25/17)
Anatomy

Cranium:               Appears normal         Aortic Arch:            Not well visualized
Cavum:                 Appears normal         Ductal Arch:            Appears normal
Ventricles:            Appears normal         Diaphragm:              Appears normal
Choroid Plexus:        Appears normal         Stomach:                Appears normal, left
sided
Cerebellum:            Appears normal         Abdomen:                Appears normal
Posterior Fossa:       Appears normal         Abdominal Wall:         Appears nml (cord
insert, abd wall)
Nuchal Fold:           Appears normal         Cord Vessels:           Appears normal (3
vessel cord)
Face:                  Appears normal         Kidneys:                Appear normal
(orbits and profile)
Lips:                  Appears normal         Bladder:                Appears normal
Thoracic:              Appears normal         Spine:                  Limited views
appear normal
Heart:                 Not well visualized    Upper Extremities:      Appears normal
RVOT:                  Not well visualized    Lower Extremities:      Appears normal
LVOT:                  Appears normal

Other:  Rt heel and Rt 5th digit not well visualized.  Lt 5th digit and Lt heel
seen. Open hands & Nasal bone visualized. Technically difficult due
to mat habitus and fetal position.
Cervix Uterus Adnexa

Cervix
Length:            3.2  cm.
Normal appearance by transabdominal scan.

Uterus
No abnormality visualized.

Left Ovary
Size(cm)       3.8  x   2.1    x  1.8       Vol(ml):
Within normal limits.

Right Ovary
Not visualized.
Impression

Single living intrauterine pregnancy at 18w 5d.
Placenta Anterior, above cervical os.
Appropriate fetal growth.
Normal amniotic fluid volume.
The fetal anatomic survey is not complete secondary to
maternal habitus and fetal position.
No gross fetal anomalies identified.
The cervix measures 3.2cm transabdominally without
funneling.
The adnexa appear normal bilaterally without masses
although right ovary not visualized.
Recommendations

Recommend follow-up ultrasound examination in 
 4 weeks

## 2019-09-20 ENCOUNTER — Institutional Professional Consult (permissible substitution): Payer: BC Managed Care – PPO | Admitting: Obstetrics and Gynecology

## 2019-09-27 ENCOUNTER — Ambulatory Visit
Admission: RE | Admit: 2019-09-27 | Discharge: 2019-09-27 | Disposition: A | Discharge status: Home / Self Care | Payer: BC Managed Care – PPO | Source: Ambulatory Visit

## 2019-09-27 ENCOUNTER — Other Ambulatory Visit: Payer: Self-pay

## 2019-09-27 DIAGNOSIS — Z0000 Encounter for general adult medical examination without abnormal findings: Secondary | ICD-10-CM

## 2019-09-27 DIAGNOSIS — Z1239 Encounter for other screening for malignant neoplasm of breast: Secondary | ICD-10-CM

## 2019-10-19 ENCOUNTER — Institutional Professional Consult (permissible substitution): Payer: BC Managed Care – PPO | Admitting: Obstetrics and Gynecology

## 2019-10-19 ENCOUNTER — Other Ambulatory Visit: Payer: Self-pay

## 2020-01-11 IMAGING — US US MFM FETAL BPP W/O NON-STRESS
1 series · 13 of 28 positions shown · non-contrast
Comparison: none

[Series 1: us mfm fetal bpp w/o non-stress · 47 acquisitions, 13 frames shown]
[im 2/47]
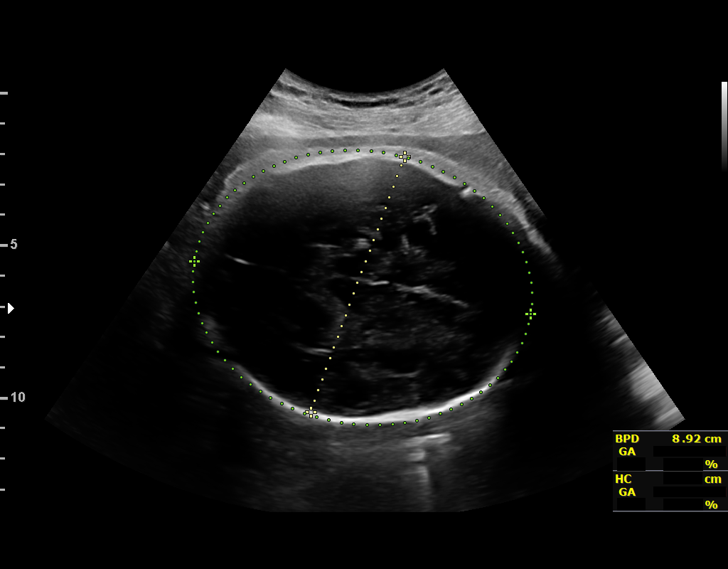
[im 6/47]
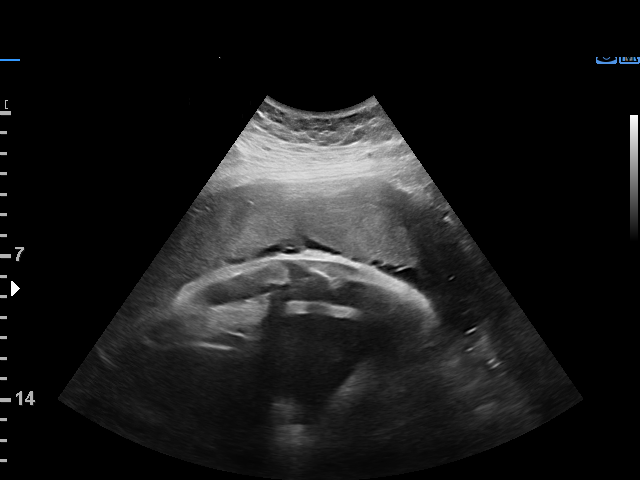
[im 9/47]
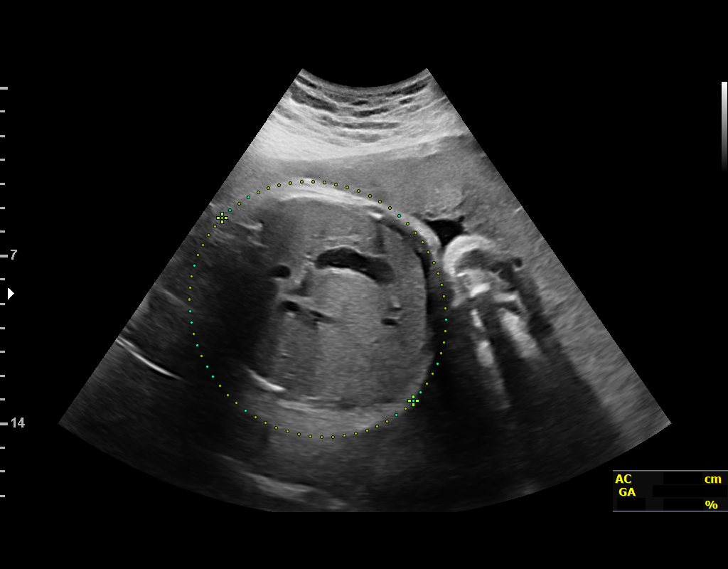
[im 12/47]
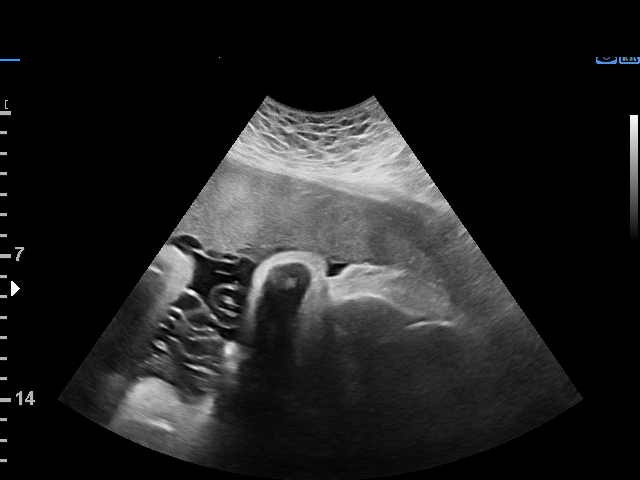
[im 16/47]
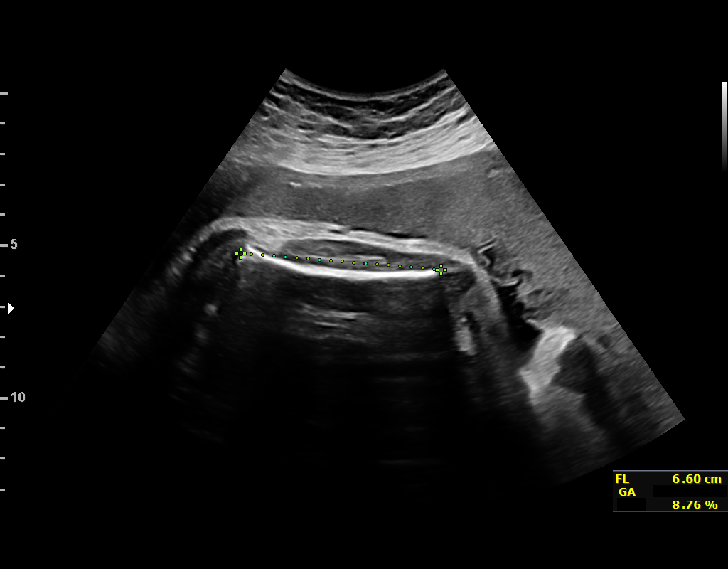
[im 19/47]
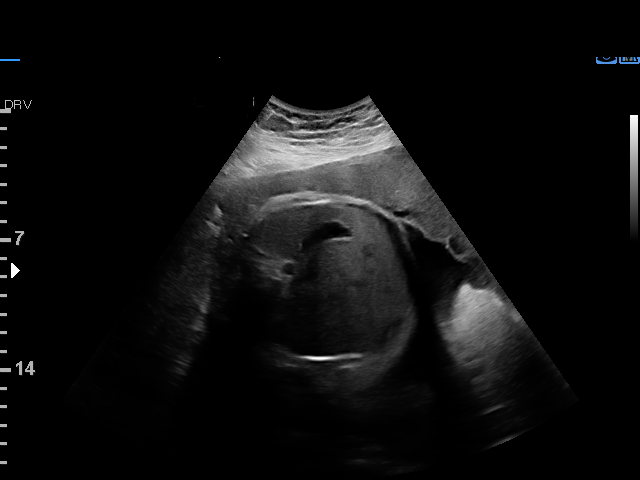
[im 24/47]
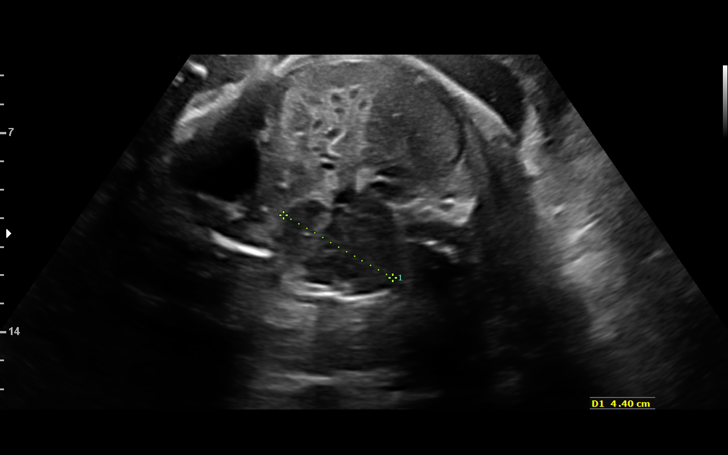
[im 28/47]
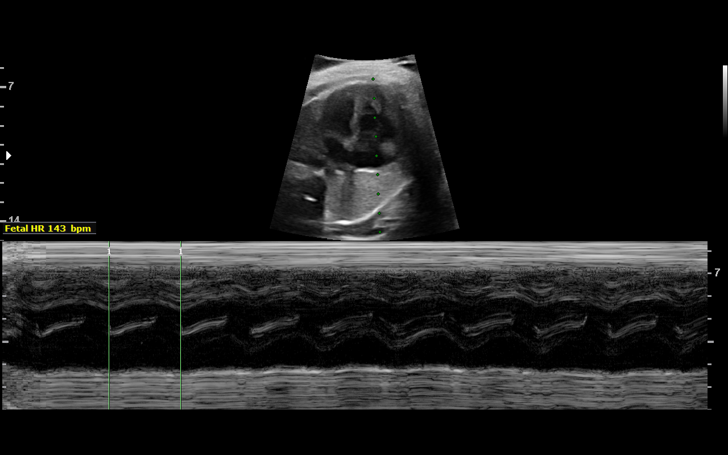
[im 31/47]
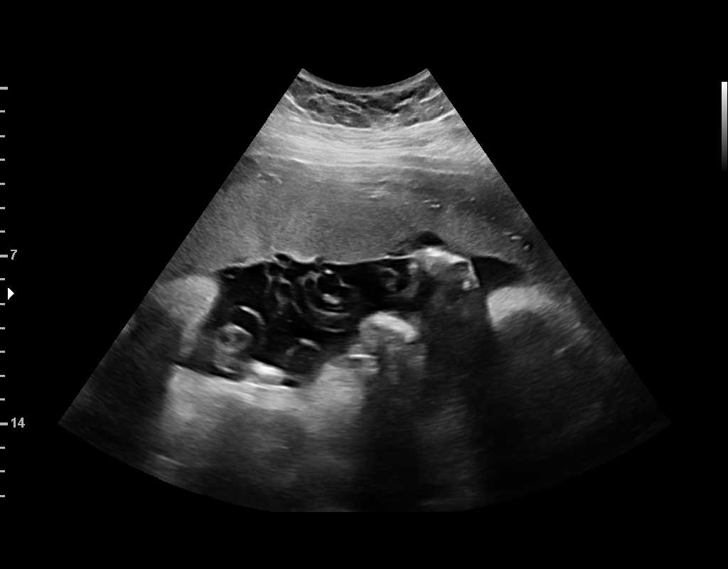
[im 35/47]
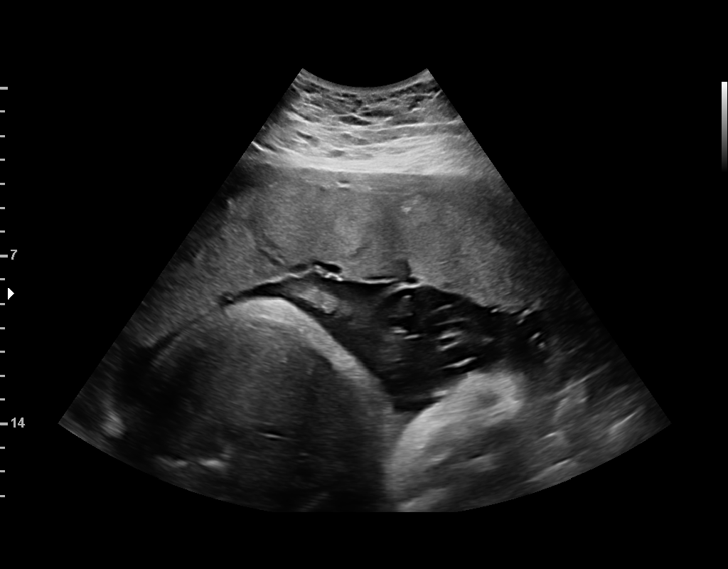
[im 38/47]
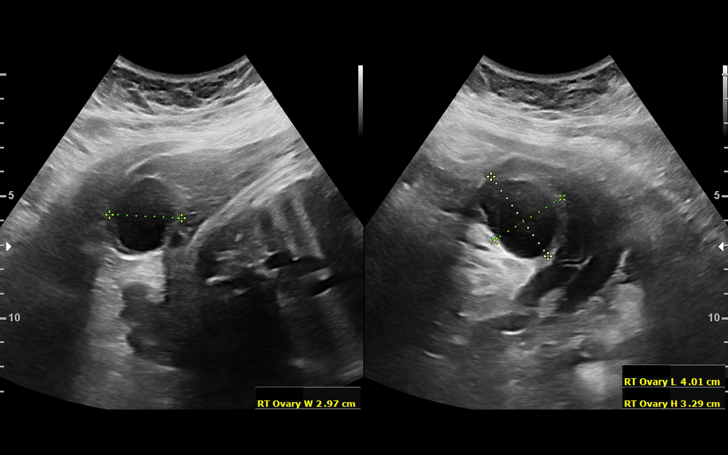
[im 41/47]
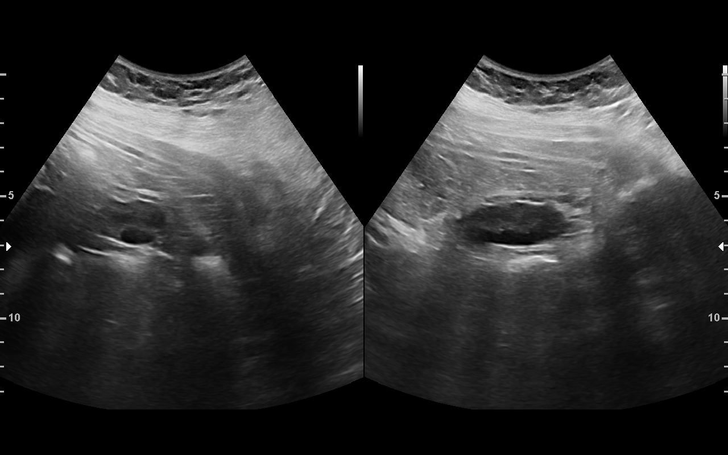
[im 45/47]
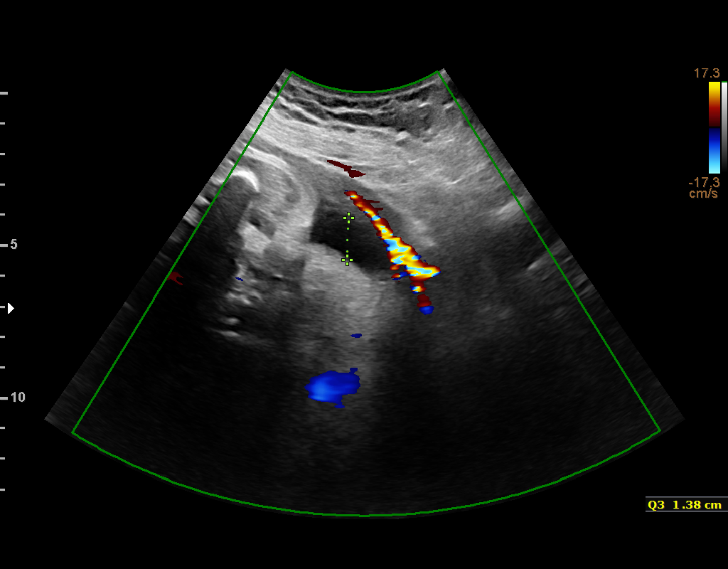

[13 of 28 positions shown; findings below may reference images not displayed]

Road [HOSPITAL]

1  HIM ARYA           [id number]      [id number]     [id number]
2  HIM ARYA           [id number]      [id number]     [id number]
Indications

35 weeks gestation of pregnancy
Poor obstetric history: Previous
preeclampsia / eclampsia/gestational HTN
History of sickle cell trait
Encounter for other antenatal screening
follow-up
Obesity complicating pregnancy, third
trimester (Pre Preg BMI 36)
Advanced maternal age multigravida 35+,
third trimester (Low Risk NIPS)
OB History

Blood Type:            Height:  5'4"   Weight (lb):  236       BMI:
Gravidity:    4         Term:   2         SAB:   1
Living:       2
Fetal Evaluation

Num Of Fetuses:     1
Fetal Heart         146
Rate(bpm):
Cardiac Activity:   Observed
Presentation:       Cephalic
Placenta:           Anterior
P. Cord Insertion:  Previously Visualized

Amniotic Fluid
AFI FV:      Subjectively within normal limits
AFI Sum(cm)     %Tile       Largest Pocket(cm)
7.54            5

RUQ(cm)       RLQ(cm)       LUQ(cm)        LLQ(cm)
0.82
Biophysical Evaluation

Amniotic F.V:   Pocket => 2 cm two         F. Tone:        Observed
planes
F. Movement:    Observed                   Score:          [DATE]
F. Breathing:   Observed
Biometry

BPD:      88.6  mm     G. Age:  35w 6d         60  %    CI:        77.19   %    70 - 86
FL/HC:      20.5   %    20.1 -
HC:      319.3  mm     G. Age:  36w 0d         25  %    HC/AC:      0.95        0.93 -
AC:      337.7  mm     G. Age:  37w 5d         95  %    FL/BPD:     74.0   %    71 - 87
FL:       65.6  mm     G. Age:  33w 6d          8  %    FL/AC:      19.4   %    20 - 24
HUM:        59  mm     G. Age:  34w 1d         36  %

Est. FW:    1003  gm      6 lb 7 oz     76  %
Gestational Age

LMP:           39w 6d        Date:  11/20/16                 EDD:   08/27/17
U/S Today:     35w 6d                                        EDD:   09/24/17
Best:          35w 5d     Det. By:  Early Ultrasound         EDD:   09/25/17
(02/25/17)
Anatomy

Cranium:               Appears normal         LVOT:                   Previously seen
Cavum:                 Previously seen        Aortic Arch:            Previously seen
Ventricles:            Previously seen        Ductal Arch:            Previously seen
Choroid Plexus:        Previously seen        Diaphragm:              Appears normal
Cerebellum:            Previously seen        Stomach:                Appears normal, left
sided
Posterior Fossa:       Previously seen        Abdomen:                Previously seen
Nuchal Fold:           Previously seen        Abdominal Wall:         Previously seen
Face:                  Orbits and profile     Cord Vessels:           Previously seen
previously seen
Lips:                  Previously seen        Kidneys:                Appear normal
Palate:                Not well visualized    Bladder:                Appears normal
Thoracic:              Appears normal         Spine:                  Previously seen
Heart:                 Appears normal         Upper Extremities:      Previously seen
(4CH, axis, and situs
RVOT:                  Previously seen        Lower Extremities:      Previously seen

Other:  Heels and 5th digit previously seen. Open hands & Nasal bone prev
visualized. Technically difficult due to mat habitus and fetal position.
Cervix Uterus Adnexa

Cervix
Not visualized (advanced GA >88wks)

Uterus
No abnormality visualized.
Left Ovary
Size(cm)       4.7  x   1.8    x  3.1       Vol(ml):
Within normal limits.

Right Ovary
Size(cm)        4   x   3.3    x  3         Vol(ml):
Simple cyst, measuring 6.6x2.1x2.2cm
Impression

AMA.
Fetal growth is appropriate for gestational age. Amniotic fluid
is normal and good fetal activity is seen. Incidentally, a small
simple was seen in the right ovary, which is not associated
with any adverse outcomes.
BPP [DATE].
Recommendations

Patient has appointments for weekly antenatal testing.

## 2020-01-25 ENCOUNTER — Other Ambulatory Visit: Payer: Self-pay

## 2020-01-25 MED ORDER — FLUCONAZOLE 150 MG PO TABS: 150.0000 mg | ORAL_TABLET | Freq: Once | ORAL | 2 refills | Status: AC

## 2020-01-25 NOTE — Telephone Encounter (Signed)
Patient would like a refill on diflucion. She is pretty sure she has a yeast infection.

## 2020-06-10 ENCOUNTER — Ambulatory Visit: Payer: BC Managed Care – PPO | Admitting: Obstetrics

## 2020-06-26 ENCOUNTER — Ambulatory Visit (INDEPENDENT_AMBULATORY_CARE_PROVIDER_SITE_OTHER): Payer: BC Managed Care – PPO | Admitting: Obstetrics

## 2020-06-26 ENCOUNTER — Other Ambulatory Visit (HOSPITAL_COMMUNITY)
Admission: RE | Admit: 2020-06-26 | Discharge: 2020-06-26 | Disposition: A | Discharge status: Home / Self Care | Payer: BC Managed Care – PPO | Source: Ambulatory Visit | Attending: Obstetrics | Admitting: Obstetrics

## 2020-06-26 ENCOUNTER — Encounter: Payer: Self-pay | Admitting: Obstetrics

## 2020-06-26 ENCOUNTER — Other Ambulatory Visit: Payer: Self-pay

## 2020-06-26 VITALS — BP 117/81 | HR 80 | Wt 239.0 lb

## 2020-06-26 DIAGNOSIS — Z01419 Encounter for gynecological examination (general) (routine) without abnormal findings: Secondary | ICD-10-CM | POA: Insufficient documentation

## 2020-06-26 DIAGNOSIS — N898 Other specified noninflammatory disorders of vagina: Secondary | ICD-10-CM | POA: Insufficient documentation

## 2020-06-26 DIAGNOSIS — Z3046 Encounter for surveillance of implantable subdermal contraceptive: Secondary | ICD-10-CM

## 2020-06-26 DIAGNOSIS — Z0000 Encounter for general adult medical examination without abnormal findings: Secondary | ICD-10-CM

## 2020-06-26 DIAGNOSIS — N946 Dysmenorrhea, unspecified: Secondary | ICD-10-CM | POA: Diagnosis not present

## 2020-06-26 DIAGNOSIS — E569 Vitamin deficiency, unspecified: Secondary | ICD-10-CM

## 2020-06-26 DIAGNOSIS — J301 Allergic rhinitis due to pollen: Secondary | ICD-10-CM

## 2020-06-26 DIAGNOSIS — F17200 Nicotine dependence, unspecified, uncomplicated: Secondary | ICD-10-CM

## 2020-06-26 MED ORDER — LORATADINE 10 MG PO TABS: 10.0000 mg | ORAL_TABLET | Freq: Every day | ORAL | 11 refills | Status: DC

## 2020-06-26 MED ORDER — IBUPROFEN 800 MG PO TABS: 800.0000 mg | ORAL_TABLET | Freq: Three times a day (TID) | ORAL | 5 refills | Status: DC | PRN

## 2020-06-26 MED ORDER — PRENATAL PLUS 27-1 MG PO TABS: 1.0000 | ORAL_TABLET | Freq: Every day | ORAL | 4 refills | Status: DC

## 2020-06-26 NOTE — Progress Notes (Signed)
Subjective:        Robin Duncan is a 40 y.o. female here for a routine exam.  Current complaints: Vaginal discharge.    Personal health questionnaire:  Is patient Ashkenazi Jewish, have a family history of breast and/or ovarian cancer: no Is there a family history of uterine cancer diagnosed at age < 59, gastrointestinal cancer, urinary tract cancer, family member who is a Personnel officer syndrome-associated carrier: no Is the patient overweight and hypertensive, family history of diabetes, personal history of gestational diabetes, preeclampsia or PCOS: no Is patient over 28, have PCOS,  family history of premature CHD under age 22, diabetes, smoke, have hypertension or peripheral artery disease:  no At any time, has a partner hit, kicked or otherwise hurt or frightened you?: no Over the past 2 weeks, have you felt down, depressed or hopeless?: no Over the past 2 weeks, have you felt little interest or pleasure in doing things?:no   Gynecologic History No LMP recorded. Patient has had an implant. Contraception: Nexplanon Last Pap: 08-23-2018. Results were: normal Last mammogram: 09-27-2019. Results were: normal  Obstetric History OB History  Gravida Para Term Preterm AB Living  4 3 3  0 1 3  SAB IAB Ectopic Multiple Live Births        0 3    # Outcome Date GA Lbr Len/2nd Weight Sex Delivery Anes PTL Lv  4 Term 09/15/17 [redacted]w[redacted]d 04:36 / 00:01 7 lb 9.2 oz (3.436 kg) M Vag-Spont EPI  LIV  3 AB           2 Term      Vag-Spont     1 Term      Vag-Spont       Past Medical History:  Diagnosis Date  . Bursitis   . Pregnancy induced hypertension   . Sickle cell trait (HCC)   . Vaginal Pap smear, abnormal     Past Surgical History:  Procedure Laterality Date  . leep       Current Outpatient Medications:  .  etonogestrel (NEXPLANON) 68 MG IMPL implant, 1 each by Subdermal route once., Disp: , Rfl:  .  ibuprofen (ADVIL) 800 MG tablet, Take 1 tablet (800 mg total) by mouth every 8  (eight) hours as needed., Disp: 30 tablet, Rfl: 5 .  loratadine (CLARITIN) 10 MG tablet, Take 1 tablet (10 mg total) by mouth daily., Disp: 30 tablet, Rfl: 11 .  prenatal vitamin w/FE, FA (PRENATAL 1 + 1) 27-1 MG TABS tablet, Take 1 tablet by mouth daily before breakfast., Disp: 90 tablet, Rfl: 4 .  metroNIDAZOLE (FLAGYL) 500 MG tablet, Take 1 tablet (500 mg total) by mouth 2 (two) times daily. (Patient not taking: Reported on 06/26/2020), Disp: 14 tablet, Rfl: 0 No Known Allergies  Social History   Tobacco Use  . Smoking status: Current Some Day Smoker    Packs/day: 0.10    Types: Cigarettes    Last attempt to quit: 09/07/2015    Years since quitting: 4.8  . Smokeless tobacco: Never Used  Substance Use Topics  . Alcohol use: Yes    Alcohol/week: 0.0 standard drinks    Comment: socially     Family History  Problem Relation Age of Onset  . Diabetes Mother   . Hypertension Father       Review of Systems  Constitutional: negative for fatigue and weight loss Respiratory: negative for cough and wheezing Cardiovascular: negative for chest pain, fatigue and palpitations Gastrointestinal: negative for abdominal pain and  change in bowel habits Musculoskeletal:negative for myalgias Neurological: negative for gait problems and tremors Behavioral/Psych: negative for abusive relationship, depression Endocrine: negative for temperature intolerance    Genitourinary: positive for vaginal discharge.  negative for abnormal menstrual periods, genital lesions, hot flashes, sexual problems  Integument/breast: negative for breast lump, breast tenderness, nipple discharge and skin lesion(s)    Objective:       BP 117/81   Pulse 80   Wt 239 lb (108.4 kg)   BMI 43.71 kg/m  General:   alert and no distress  Skin:   no rash or abnormalities  Lungs:   clear to auscultation bilaterally  Heart:   regular rate and rhythm, S1, S2 normal, no murmur, click, rub or gallop  Breasts:   normal without  suspicious masses, skin or nipple changes or axillary nodes  Abdomen:  normal findings: no organomegaly, soft, non-tender and no hernia  Pelvis:  External genitalia: normal general appearance Urinary system: urethral meatus normal and bladder without fullness, nontender Vaginal: normal without tenderness, induration or masses Cervix: normal appearance Adnexa: normal bimanual exam Uterus: anteverted and non-tender, normal size   Lab Review Urine pregnancy test Labs reviewed yes Radiologic studies reviewed yes  I have spent a total of 20 minutes of face-to-face time, excluding clinical staff time, reviewing notes and preparing to see patient, ordering tests and/or medications, and counseling the patient.  Assessment:     1. Encounter for gynecological examination with Papanicolaou smear of cervix Rx: - Cytology - PAP( [facility])  2. Vaginal discharge Rx: - Cervicovaginal ancillary only  3. Dysmenorrhea Rx: - ibuprofen (ADVIL) 800 MG tablet; Take 1 tablet (800 mg total) by mouth every 8 (eight) hours as needed.  Dispense: 30 tablet; Refill: 5  4. Vitamin deficiency Rx: - prenatal vitamin w/FE, FA (PRENATAL 1 + 1) 27-1 MG TABS tablet; Take 1 tablet by mouth daily before breakfast.  Dispense: 90 tablet; Refill: 4  5. Encounter for surveillance of implantable subdermal contraceptive - pleased with Nexplanon  6. Routine adult health maintenance Rx: - Ambulatory referral to [facility]  7. Tobacco dependence - cessation with the aid of medication and behavioral modification recommended  8. Seasonal allergic rhinitis due to pollen Rx: - loratadine (CLARITIN) 10 MG tablet; Take 1 tablet (10 mg total) by mouth daily.  Dispense: 30 tablet; Refill: 11    Plan:    Education reviewed: calcium supplements, depression evaluation, low fat, low cholesterol diet, safe sex/STD prevention, self breast exams, smoking cessation and weight bearing exercise. Contraception:  Nexplanon. Follow up in: 1 year.   Meds ordered this encounter  Medications  . ibuprofen (ADVIL) 800 MG tablet    Sig: Take 1 tablet (800 mg total) by mouth every 8 (eight) hours as needed.    Dispense:  30 tablet    Refill:  5  . loratadine (CLARITIN) 10 MG tablet    Sig: Take 1 tablet (10 mg total) by mouth daily.    Dispense:  30 tablet    Refill:  11  . prenatal vitamin w/FE, FA (PRENATAL 1 + 1) 27-1 MG TABS tablet    Sig: Take 1 tablet by mouth daily before breakfast.    Dispense:  90 tablet    Refill:  4   Orders Placed This Encounter  Procedures  . Ambulatory referral to [facility]    Referral Priority:   Routine    Referral Type:   Consultation    Referral Reason:   Specialty Services Required  Requested Specialty:   Family Medicine    Number of Visits Requested:   1    Brock Bad, MD 06/26/2020 3:04 PM

## 2020-06-26 NOTE — Progress Notes (Signed)
Pt presents for annual, pap, and all STD testing.   

## 2020-06-27 ENCOUNTER — Other Ambulatory Visit: Payer: Self-pay | Admitting: Obstetrics

## 2020-06-27 DIAGNOSIS — B379 Candidiasis, unspecified: Secondary | ICD-10-CM

## 2020-06-27 DIAGNOSIS — N760 Acute vaginitis: Secondary | ICD-10-CM

## 2020-06-27 DIAGNOSIS — B9689 Other specified bacterial agents as the cause of diseases classified elsewhere: Secondary | ICD-10-CM

## 2020-06-27 LAB — CERVICOVAGINAL ANCILLARY ONLY
Bacterial Vaginitis (gardnerella): POSITIVE — AB
Candida Glabrata: NEGATIVE
Candida Vaginitis: POSITIVE — AB
Chlamydia: NEGATIVE
Comment: NEGATIVE
Comment: NEGATIVE
Comment: NEGATIVE
Comment: NEGATIVE
Comment: NEGATIVE
Comment: NORMAL
Neisseria Gonorrhea: NEGATIVE
Trichomonas: NEGATIVE

## 2020-06-27 LAB — CYTOLOGY - PAP
Comment: NEGATIVE
Diagnosis: NEGATIVE
High risk HPV: NEGATIVE

## 2020-06-27 MED ORDER — METRONIDAZOLE 500 MG PO TABS: 500.0000 mg | ORAL_TABLET | Freq: Two times a day (BID) | ORAL | 2 refills | Status: DC

## 2020-06-27 MED ORDER — FLUCONAZOLE 150 MG PO TABS: 150.0000 mg | ORAL_TABLET | Freq: Once | ORAL | 0 refills | Status: DC

## 2020-08-05 ENCOUNTER — Ambulatory Visit (INDEPENDENT_AMBULATORY_CARE_PROVIDER_SITE_OTHER): Payer: BC Managed Care – PPO | Admitting: Family Medicine

## 2020-08-05 ENCOUNTER — Other Ambulatory Visit: Payer: Self-pay

## 2020-08-05 ENCOUNTER — Encounter: Payer: Self-pay | Admitting: Family Medicine

## 2020-08-05 VITALS — BP 130/80 | HR 91 | Ht 62.0 in | Wt 228.6 lb

## 2020-08-05 DIAGNOSIS — Z789 Other specified health status: Secondary | ICD-10-CM

## 2020-08-05 DIAGNOSIS — K5900 Constipation, unspecified: Secondary | ICD-10-CM

## 2020-08-05 DIAGNOSIS — R030 Elevated blood-pressure reading, without diagnosis of hypertension: Secondary | ICD-10-CM

## 2020-08-05 DIAGNOSIS — Z7689 Persons encountering health services in other specified circumstances: Secondary | ICD-10-CM | POA: Diagnosis not present

## 2020-08-05 DIAGNOSIS — Z7289 Other problems related to lifestyle: Secondary | ICD-10-CM

## 2020-08-05 DIAGNOSIS — Z8342 Family history of familial hypercholesterolemia: Secondary | ICD-10-CM | POA: Diagnosis not present

## 2020-08-05 DIAGNOSIS — Z1159 Encounter for screening for other viral diseases: Secondary | ICD-10-CM

## 2020-08-05 MED ORDER — POLYETHYLENE GLYCOL 3350 17 G PO PACK: 17.0000 g | PACK | Freq: Every day | ORAL | 0 refills | Status: DC

## 2020-08-05 NOTE — Patient Instructions (Addendum)
Today we talked about starting miralax and stopping prenatal vitamins to help with constipation.   We also discussed decreasing alcohol intake and vaping for overall health.   Follow up in 1 week and we will discuss labs.  Dr. Salvadore Dom

## 2020-08-05 NOTE — Progress Notes (Signed)
   Subjective:    Patient ID: Robin Duncan, female    DOB: August 20, 1980, 40 y.o.   MRN: [id number]   CC: Establish care  HPI:  Robin Duncan is a very pleasant 40 y.o. female who presents today to establish care.  Initial concerns:  Constipation Intermittent chronic constipation. Not sure when it began. States it has been a problem for a long time. Most recently last week took a trip to the beach and did not have a bowel movement for the entire 4 day trip. Normal bowel movement have drink an unknown herbal tea. Denies painful or bloody bowel movement. Gets headaches when constipated. No nausea or vomiting. Has never tried miralax but open to trying it. Does take iron supplement in the form of prenatal vitamin.   Past medical history: Sickle cell trait, vitamin d deficiency  Past surgical history: Leep procedure 2005   Current medications: Nexplanon, zyrtec PRN, PNV  Family history: Sickle cell anemia, breast/colon cancer (aunt, uncle), DM, HLD, HTN, Thyroid disease, drug abuse  Social history: Alcohol use every weekend 4-5 drinks. Robin Duncan is drink of choice. Occasionally beer. Denies tobacco or other use of illicit drugs.   ROS: pertinent noted in the HPI   Objective:  BP 130/80   Pulse 91   Ht 5\' 2"  (1.575 m)   Wt 228 lb 9.6 oz (103.7 kg)   SpO2 99%   BMI 41.81 kg/m   Vitals and nursing note reviewed  General: NAD, pleasant, able to participate in exam Cardiac: RRR, S1 S2 present. normal heart sounds, no murmurs. Respiratory: CTAB, normal effort, No wheezes, rales or rhonchi Abdomen: Bowel sounds present, non-tender, non-distended, no hepatosplenomegaly Extremities: no edema or cyanosis. Skin: warm and dry, no rashes noted Neuro: alert, no obvious focal deficits Psych: Normal affect and mood   Assessment & Plan:  1. Encounter to establish care 40 yo F well appearing. Appreciative to be PCP.  2. Constipation, unspecified constipation type Chronic. Likely  induced from iron containin PNV.  - Stop PNV - Start polyethylene glycol (MIRALAX / GLYCOLAX) 17 g packet; Take 17 g by mouth daily.  Dispense: 14 each; Refill: 0 -F/u 1 week  3. Elevated blood pressure reading Fam hx of HTN -BP recheck in 1 week at follow up  4. Family history of high cholesterol - Lipid Panel  5. Alcohol use Every weekend 4-5 drinks - Hepatic Function Panel  6. Need for hepatitis C screening test - HCV Ab w Reflex to Quant PCR   No problem-specific Assessment & Plan notes found for this encounter.    44, DO [facility] PGY-2

## 2020-08-05 NOTE — Progress Notes (Signed)
stablis

## 2020-08-06 LAB — HCV INTERPRETATION

## 2020-08-06 LAB — LIPID PANEL
Chol/HDL Ratio: 3 ratio (ref 0.0–4.4)
Cholesterol, Total: 160 mg/dL (ref 100–199)
HDL: 53 mg/dL (ref >39)
LDL Chol Calc (NIH): 89 mg/dL (ref 0–99)
Triglycerides: 96 mg/dL (ref 0–149)
VLDL Cholesterol Cal: 18 mg/dL (ref 5–40)

## 2020-08-06 LAB — HEPATIC FUNCTION PANEL
ALT: 17 IU/L (ref 0–32)
AST: 17 IU/L (ref 0–40)
Albumin: 4.4 g/dL (ref 3.8–4.8)
Alkaline Phosphatase: 102 IU/L (ref 44–121)
Bilirubin Total: <0.2 mg/dL (ref 0.0–1.2)
Bilirubin, Direct: <0.1 mg/dL (ref 0.00–0.40)
Total Protein: 6.7 g/dL (ref 6.0–8.5)

## 2020-08-06 LAB — HCV AB W REFLEX TO QUANT PCR: HCV Ab: 0.1 s/co ratio (ref 0.0–0.9)

## 2020-08-12 ENCOUNTER — Encounter: Payer: Self-pay | Admitting: Family Medicine

## 2020-08-16 ENCOUNTER — Ambulatory Visit (INDEPENDENT_AMBULATORY_CARE_PROVIDER_SITE_OTHER): Payer: BC Managed Care – PPO | Admitting: Family Medicine

## 2020-08-16 ENCOUNTER — Other Ambulatory Visit: Payer: Self-pay

## 2020-08-16 VITALS — BP 120/80 | HR 85 | Wt 220.8 lb

## 2020-08-16 DIAGNOSIS — Z09 Encounter for follow-up examination after completed treatment for conditions other than malignant neoplasm: Secondary | ICD-10-CM

## 2020-08-16 DIAGNOSIS — Z23 Encounter for immunization: Secondary | ICD-10-CM

## 2020-08-16 NOTE — Progress Notes (Signed)
    SUBJECTIVE:   CHIEF COMPLAINT / HPI:   Ms. Robin Duncan is a 40 year old female who presents for follow-up for the issues below.  Constipation Has resolved.  Was taking MiraLAX daily as well as using a detox program. She has had a lot of stool output with now regular bowel movements. She also wears a sauna suit and rubs Abolene on for weight loss; she was informed to do this by a Architect. Acknowledges she has lost 8 pounds since her last visit and she is happy about this. She plans to go work out today after this visit.   Elevated blood pressure reading Has not checked blood pressure at home. Did have a headache yesterday but did not check it due to having an appointment today.   Labs Aware of labs on my chart but unsure how to interpret.   PERTINENT  PMH / PSH: Hx of PIH, sickle cell trait, nexplanon implant  OBJECTIVE:   BP 120/80   Pulse 85   Wt 220 lb 12.8 oz (100.2 kg)   SpO2 98%   BMI 40.38 kg/m   General: Appears well, no acute distress. Age appropriate. Wearing work out Engineering geologist Respiratory: normal effort Psych: normal affect  ASSESSMENT/PLAN:   1. Follow up Chronic constipation-has resolved.  Discussed can continue daily use of MiraLAX.  Discussed if increased frequency of bowel movements can scale back MiraLAX or increase MiraLAX if constipation returns.  Follow-up if symptoms return. Elevated blood pressure reading-within normal limits today also discussed BP will continue to decrease as patient loses weight. Labs-within normal limits, especially alkaline phosphatase with 8 pound weight loss.  Suspicion for malignancy low. Weight loss-patient has lost 8 pounds since last visit 11 days ago endorses increased frequency and regular bowel movements due to resolution of constipation.  She is also actively trying to lose weight with using a sauna suit and Albolene.  Alkaline phosphatase within normal limits lower suspicion for malignancy at this time.  We will continue  to monitor at follow-up visit.  2. Need for pneumococcal vaccine - Pneumococcal conjugate vaccine 20-valent (Prevnar 20)  Lavonda Jumbo, DO [facility]

## 2020-08-16 NOTE — Patient Instructions (Addendum)
Thank for coming in today.  I am glad that your constipation has improved.  You can continue MiraLAX daily.  And today your blood pressure looks wonderful.  Continue to make lifestyle changes discussed to further improve blood pressure.  Thank you for getting your pneumococcal vaccine today.  Follow-up as needed.

## 2020-11-04 ENCOUNTER — Encounter: Payer: Self-pay | Admitting: Obstetrics and Gynecology

## 2020-11-04 ENCOUNTER — Ambulatory Visit (INDEPENDENT_AMBULATORY_CARE_PROVIDER_SITE_OTHER): Payer: BC Managed Care – PPO | Admitting: Obstetrics and Gynecology

## 2020-11-04 ENCOUNTER — Other Ambulatory Visit: Payer: Self-pay

## 2020-11-04 DIAGNOSIS — Z3046 Encounter for surveillance of implantable subdermal contraceptive: Secondary | ICD-10-CM | POA: Diagnosis not present

## 2020-11-04 DIAGNOSIS — Z713 Dietary counseling and surveillance: Secondary | ICD-10-CM | POA: Insufficient documentation

## 2020-11-04 LAB — POCT URINE PREGNANCY: Preg Test, Ur: NEGATIVE

## 2020-11-04 MED ORDER — ETONOGESTREL 68 MG ~~LOC~~ IMPL
68.0000 mg | DRUG_IMPLANT | Freq: Once | SUBCUTANEOUS | Status: AC
Start: 2020-11-04 — End: 2020-11-04
  Administered 2020-11-04: 68 mg via SUBCUTANEOUS

## 2020-11-04 NOTE — Patient Instructions (Signed)
Nexplanon Instructions After Insertion  Keep bandage clean and dry for 24 hours  May use ice/Tylenol/Ibuprofen for soreness or pain  If you develop fever, drainage or increased warmth from incision site-contact office immediately   

## 2020-11-04 NOTE — Addendum Note (Signed)
Addended by: Charlsie Quest B on: 11/04/2020 03:19 PM   Modules accepted: Orders

## 2020-11-04 NOTE — Progress Notes (Signed)
Robin Duncan is a 40 y.o. [phone] here for Nexplanon removal and Nexplanon insertion.  Last pap smear was on 6/22 and was normal.  No other gynecologic concerns.   Nexplanon Removal and Insertion  Patient identified, informed consent performed, consent signed.   Patient does understand that irregular bleeding is a very common side effect of this medication. She was advised to have backup contraception for one week after replacement of the implant. Pregnancy test in clinic today was negative.  Appropriate time out taken. Implanon site identified. Area prepped in usual sterile fashon. One ml of 1% lidocaine was used to anesthetize the area at the distal end of the implant. A small stab incision was made right beside the implant on the distal portion. The Nexplanon rod was grasped using hemostats and removed without difficulty. There was minimal blood loss. There were no complications. Area was then injected with 3 ml of 1 % lidocaine. She was re-prepped with betadine, Nexplanon removed from packaging, Device confirmed in needle, then inserted full length of needle and withdrawn per handbook instructions. Nexplanon was able to palpated in the patient's arm; patient palpated the insert herself.  There was minimal blood loss. Patient insertion site covered with guaze and a pressure bandage to reduce any bruising. The patient tolerated the procedure well and was given post procedure instructions.  She was advised to have backup contraception for one week.    Hermina Staggers, MD, FACOG Attending Obstetrician & Gynecologist Center for [facility], [facility]

## 2020-11-04 NOTE — Progress Notes (Signed)
Pt is here today for Nexplanon removal and reinsertion. Last Nexplanon was placed in 09/2017.

## 2020-12-12 ENCOUNTER — Ambulatory Visit: Payer: BC Managed Care – PPO | Admitting: Obstetrics and Gynecology

## 2021-01-10 ENCOUNTER — Emergency Department (HOSPITAL_COMMUNITY): Payer: BC Managed Care – PPO

## 2021-01-10 ENCOUNTER — Encounter (HOSPITAL_COMMUNITY): Payer: Self-pay

## 2021-01-10 ENCOUNTER — Other Ambulatory Visit: Payer: Self-pay

## 2021-01-10 ENCOUNTER — Emergency Department (HOSPITAL_COMMUNITY)
Admission: EM | Admit: 2021-01-10 | Discharge: 2021-01-10 | Disposition: A | Discharge status: Home / Self Care | Payer: BC Managed Care – PPO | Attending: Emergency Medicine | Admitting: Emergency Medicine

## 2021-01-10 DIAGNOSIS — Z20822 Contact with and (suspected) exposure to covid-19: Secondary | ICD-10-CM | POA: Insufficient documentation

## 2021-01-10 DIAGNOSIS — R0789 Other chest pain: Secondary | ICD-10-CM | POA: Diagnosis present

## 2021-01-10 DIAGNOSIS — N9489 Other specified conditions associated with female genital organs and menstrual cycle: Secondary | ICD-10-CM | POA: Insufficient documentation

## 2021-01-10 DIAGNOSIS — F17210 Nicotine dependence, cigarettes, uncomplicated: Secondary | ICD-10-CM | POA: Diagnosis not present

## 2021-01-10 DIAGNOSIS — J40 Bronchitis, not specified as acute or chronic: Secondary | ICD-10-CM

## 2021-01-10 DIAGNOSIS — H1031 Unspecified acute conjunctivitis, right eye: Secondary | ICD-10-CM | POA: Diagnosis not present

## 2021-01-10 LAB — TROPONIN I (HIGH SENSITIVITY): Troponin I (High Sensitivity): 3 ng/L (ref <18)

## 2021-01-10 LAB — BASIC METABOLIC PANEL
Anion gap: 8 (ref 5–15)
BUN: 11 mg/dL (ref 6–20)
CO2: 23 mmol/L (ref 22–32)
Calcium: 8.9 mg/dL (ref 8.9–10.3)
Chloride: 106 mmol/L (ref 98–111)
Creatinine, Ser: 0.74 mg/dL (ref 0.44–1.00)
GFR, Estimated: >60 mL/min (ref >60)
Glucose, Bld: 109 mg/dL — ABNORMAL HIGH (ref 70–99)
Potassium: 3.7 mmol/L (ref 3.5–5.1)
Sodium: 137 mmol/L (ref 135–145)

## 2021-01-10 LAB — CBC WITH DIFFERENTIAL/PLATELET
Abs Immature Granulocytes: 0.04 10*3/uL (ref 0.00–0.07)
Basophils Absolute: 0 10*3/uL (ref 0.0–0.1)
Basophils Relative: 0 %
Eosinophils Absolute: 0.4 10*3/uL (ref 0.0–0.5)
Eosinophils Relative: 4 %
HCT: 38.9 % (ref 36.0–46.0)
Hemoglobin: 12.9 g/dL (ref 12.0–15.0)
Immature Granulocytes: 0 %
Lymphocytes Relative: 32 %
Lymphs Abs: 3.2 10*3/uL (ref 0.7–4.0)
MCH: 26.4 pg (ref 26.0–34.0)
MCHC: 33.2 g/dL (ref 30.0–36.0)
MCV: 79.6 fL — ABNORMAL LOW (ref 80.0–100.0)
Monocytes Absolute: 0.7 10*3/uL (ref 0.1–1.0)
Monocytes Relative: 7 %
Neutro Abs: 5.6 10*3/uL (ref 1.7–7.7)
Neutrophils Relative %: 57 %
Platelets: 274 10*3/uL (ref 150–400)
RBC: 4.89 MIL/uL (ref 3.87–5.11)
RDW: 13.2 % (ref 11.5–15.5)
WBC: 9.8 10*3/uL (ref 4.0–10.5)
nRBC: 0 % (ref 0.0–0.2)

## 2021-01-10 LAB — RESP PANEL BY RT-PCR (FLU A&B, COVID) ARPGX2
Influenza A by PCR: NEGATIVE
Influenza B by PCR: NEGATIVE
SARS Coronavirus 2 by RT PCR: NEGATIVE

## 2021-01-10 LAB — HCG, QUANTITATIVE, PREGNANCY: hCG, Beta Chain, Quant, S: <1 m[IU]/mL (ref <5)

## 2021-01-10 MED ORDER — PREDNISONE 20 MG PO TABS
60.0000 mg | ORAL_TABLET | Freq: Once | ORAL | Status: AC
  Administered 2021-01-10: 60 mg via ORAL
  Filled 2021-01-10: qty 3

## 2021-01-10 MED ORDER — FLUORESCEIN SODIUM 1 MG OP STRP
1.0000 | ORAL_STRIP | Freq: Once | OPHTHALMIC | Status: AC
  Administered 2021-01-10: 1 via OPHTHALMIC
  Filled 2021-01-10: qty 1

## 2021-01-10 MED ORDER — ERYTHROMYCIN 5 MG/GM OP OINT: 1.0000 "application " | TOPICAL_OINTMENT | Freq: Four times a day (QID) | OPHTHALMIC | 0 refills | Status: AC

## 2021-01-10 MED ORDER — TETRACAINE HCL 0.5 % OP SOLN
2.0000 [drp] | Freq: Once | OPHTHALMIC | Status: AC
  Administered 2021-01-10: 2 [drp] via OPHTHALMIC
  Filled 2021-01-10: qty 4

## 2021-01-10 MED ORDER — PREDNISONE 20 MG PO TABS: 40.0000 mg | ORAL_TABLET | Freq: Every day | ORAL | 0 refills | Status: AC

## 2021-01-10 NOTE — Discharge Instructions (Signed)
You were evaluated in the Emergency Department and after careful evaluation, we did not find any emergent condition requiring admission or further testing in the hospital.  Your exam/testing today was overall reassuring.  Cough and sore throat seem to be due to a viral bronchitis.  Take the prednisone medication as directed to help you feel better.  We will treat your eye symptoms with a bacterial appointment.  Recommend follow-up with an eye doctor if eye is not improving after 3 to 5 days.  Please return to the Emergency Department if you experience any worsening of your condition.  Thank you for allowing [place] to be a part of your care.

## 2021-01-10 NOTE — ED Triage Notes (Signed)
Pt states that she has been having cough, sore throat, CP for the past 3 days and this morning woke up with redness of her R eye

## 2021-01-10 NOTE — ED Provider Notes (Signed)
[facility]Y DEPT [facility] Emergency Department Provider Note MRN:  [id number]  Arrival date & time: 01/10/21     Chief Complaint   Chest Pain   History of Present Illness   Robin Duncan is a 40 y.o. year-old female with no pertinent past medical history presenting to the ED with chief complaint of chest pain.  Patient has been experiencing cough, sore throat, hoarse voice for the past 3 days.  Starting to have some soreness in her chest with the cough.  Has occasionally had a small amount of blood streaks after coughing.  No shortness of breath, no leg pain or swelling.  Also has some eye redness and irritation on the right, some crusty discharge in the morning for the past day.  Denies vision loss, no headache, no abdominal pain, no other complaints.  Symptoms mild to moderate, constant, no exacerbating or alleviating factors.  Review of Systems  A complete 10 system review of systems was obtained and all systems are negative except as noted in the HPI and PMH.   Patient's Health History    Past Medical History:  Diagnosis Date   Bursitis    Pregnancy induced hypertension    Sickle cell trait (HCC)    Vaginal Pap smear, abnormal     Past Surgical History:  Procedure Laterality Date   leep      Family History  Problem Relation Age of Onset   Diabetes Mother    Hyperlipidemia Father    Hypertension Father    Thyroid disease Sister    Hypertension Sister    Diabetes Sister    Alcohol abuse Sister    Alcohol abuse Brother    Breast cancer Other    Colon cancer Other     Social History   Socioeconomic History   Marital status: Single    Spouse name: Not on file   Number of children: Not on file   Years of education: Not on file   Highest education level: Not on file  Occupational History   Not on file  Tobacco Use   Smoking status: Some Days    Packs/day: 0.10    Types: Cigarettes    Last attempt to quit: 09/07/2015    Years since quitting: 5.3    Smokeless tobacco: Never  Vaping Use   Vaping Use: Some days  Substance and Sexual Activity   Alcohol use: Yes    Alcohol/week: 0.0 standard drinks    Comment: socially    Drug use: No   Sexual activity: Yes    Partners: Male    Birth control/protection: Implant  Other Topics Concern   Not on file  Social History Narrative   Not on file   Social Determinants of Health   Financial Resource Strain: Not on file  Food Insecurity: Not on file  Transportation Needs: Not on file  Physical Activity: Not on file  Stress: Not on file  Social Connections: Not on file  Intimate Partner Violence: Not on file     Physical Exam   Vitals:   01/10/21 2126 01/10/21 2315  BP: (!) 131/121 130/71  Pulse: (!) 103 83  Resp: 16 16  Temp: 98.6 F (37 C)   SpO2: 100% 100%    CONSTITUTIONAL: Well-appearing, NAD NEURO:  Alert and oriented x 3, no focal deficits EYES:  eyes equal and reactive, normal extraocular movements, erythematous right conjunctiva ENT/NECK:  no LAD, no JVD CARDIO: Regular rate, well-perfused, normal S1 and S2 PULM:  CTAB no  wheezing or rhonchi GI/GU:  normal bowel sounds, non-distended, non-tender MSK/SPINE:  No gross deformities, no edema SKIN:  no rash, atraumatic PSYCH:  Appropriate speech and behavior  *Additional and/or pertinent findings included in MDM below  Diagnostic and Interventional Summary    EKG Interpretation  Date/Time:    Ventricular Rate:    PR Interval:    QRS Duration:   QT Interval:    QTC Calculation:   R Axis:     Text Interpretation:         Labs Reviewed  CBC WITH DIFFERENTIAL/PLATELET - Abnormal; Notable for the following components:      Result Value   MCV 79.6 (*)    All other components within normal limits  BASIC METABOLIC PANEL - Abnormal; Notable for the following components:   Glucose, Bld 109 (*)    All other components within normal limits  RESP PANEL BY RT-PCR (FLU A&B, COVID) ARPGX2  HCG, QUANTITATIVE,  PREGNANCY  TROPONIN I (HIGH SENSITIVITY)  TROPONIN I (HIGH SENSITIVITY)    DG Chest 2 View  Final Result      Medications  fluorescein ophthalmic strip 1 strip (1 strip Right Eye Given 01/10/21 2343)  tetracaine (PONTOCAINE) 0.5 % ophthalmic solution 2 drop (2 drops Right Eye Given 01/10/21 2343)  predniSONE (DELTASONE) tablet 60 mg (60 mg Oral Given 01/10/21 2340)     Procedures  /  Critical Care Procedures  ED Course and Medical Decision Making  I have reviewed the triage vital signs, the nursing notes, and pertinent available records from the EMR.  Listed above are laboratory and imaging tests that I personally ordered, reviewed, and interpreted and then considered in my medical decision making (see below for details).  Cough, sore throat, favoring viral illness.  Vital signs are normal, chest x-ray without signs of pneumonia.  Negative for flu, negative for COVID.  Small streaks of hemoptysis, no evidence of DVT, highly doubt PE.  Will treat with steroids.  We will also cover for bacterial conjunctivitis.  Bedside Woods lamp testing without evidence of corneal abrasion or ulcer.       Elmer Sow. Pilar Plate, MD [facility] Emergency Medicine [facility] [email or web address]  Final Clinical Impressions(s) / ED Diagnoses     ICD-10-CM   1. Bronchitis  J40     2. Acute conjunctivitis of right eye, unspecified acute conjunctivitis type  H10.31       ED Discharge Orders          Ordered    predniSONE (DELTASONE) 20 MG tablet  Daily        01/10/21 2346    erythromycin ophthalmic ointment  4 times daily        01/10/21 2346             Discharge Instructions Discussed with and Provided to Patient:    Discharge Instructions      You were evaluated in the Emergency Department and after careful evaluation, we did not find any emergent condition requiring admission or further testing in the hospital.  Your exam/testing today was overall reassuring.   Cough and sore throat seem to be due to a viral bronchitis.  Take the prednisone medication as directed to help you feel better.  We will treat your eye symptoms with a bacterial appointment.  Recommend follow-up with an eye doctor if eye is not improving after 3 to 5 days.  Please return to the Emergency Department if you experience any worsening of your condition.  Thank you for allowing [place] to be a part of your care.        Sabas Sous, MD 01/10/21 2350

## 2021-01-10 NOTE — ED Provider Notes (Signed)
Emergency Medicine Provider Triage Evaluation Note  Robin Duncan , a 40 y.o. female  was evaluated in triage.  Pt complains of chest pain.  Started 3 days ago, worse when she coughs.  Also having occasional bloody streaks in her sputum.  Chest pain is intermittent, no nausea or vomiting.  Does not feel short of breath.  Patient vapes once or twice a day, not on oral birth control, no history of surgeries or recent travel, no history of PE, DVT or previous MI  Woke up this morning with injected right eye, it is itching but there is no discharge.  Does not feel like her eyes are stuck together, does not wear contacts, no vision changes..  Review of Systems  Positive: ABOVE Negative: SYNCOPE  Physical Exam  There were no vitals taken for this visit. Gen:   Awake, no distress   Resp:  Normal effort  MSK:   Moves extremities without difficulty  Other:  Injected right eye without any mucopurulent discharge.  Slight watering, no pain with EOM.  EOMI.  Radial pulse 2+, S1-S2.    Medical Decision Making  Medically screening exam initiated at 9:13 PM.  Appropriate orders placed.  Jaysha L Maxham was informed that the remainder of the evaluation will be completed by another provider, this initial triage assessment does not replace that evaluation, and the importance of remaining in the ED until their evaluation is complete.  Suspect a virus, given chest pain will get labs and trop.  Eye appears viral   Theron Arista, Cordelia Poche 01/10/21 2115    Bethann Berkshire, MD 01/10/21 [phone]

## 2021-01-28 ENCOUNTER — Ambulatory Visit: Payer: BC Managed Care – PPO | Admitting: Family Medicine

## 2021-01-28 NOTE — Progress Notes (Deleted)
° ° °  SUBJECTIVE:   CHIEF COMPLAINT / HPI:   ***  PERTINENT  PMH / PSH: SS trait Bronchitis at [facility] in December   OBJECTIVE:   There were no vitals taken for this visit.  ***  ASSESSMENT/PLAN:   No problem-specific Assessment & Plan notes found for this encounter.     Carney Living, MD [facility]

## 2021-07-01 ENCOUNTER — Encounter: Payer: Self-pay | Admitting: *Deleted

## 2021-07-22 ENCOUNTER — Ambulatory Visit: Payer: BC Managed Care – PPO

## 2021-07-22 ENCOUNTER — Other Ambulatory Visit: Payer: Self-pay | Admitting: Obstetrics and Gynecology

## 2021-07-24 ENCOUNTER — Ambulatory Visit: Payer: BC Managed Care – PPO | Admitting: Obstetrics

## 2021-07-31 ENCOUNTER — Other Ambulatory Visit: Payer: Self-pay | Admitting: Emergency Medicine

## 2021-07-31 DIAGNOSIS — B379 Candidiasis, unspecified: Secondary | ICD-10-CM

## 2021-07-31 MED ORDER — FLUCONAZOLE 150 MG PO TABS: 150.0000 mg | ORAL_TABLET | Freq: Once | ORAL | 0 refills | Status: AC

## 2021-08-19 ENCOUNTER — Ambulatory Visit: Payer: BC Managed Care – PPO | Admitting: Family Medicine

## 2021-09-01 ENCOUNTER — Encounter: Payer: Self-pay | Admitting: Student

## 2021-09-01 ENCOUNTER — Ambulatory Visit (INDEPENDENT_AMBULATORY_CARE_PROVIDER_SITE_OTHER): Payer: BC Managed Care – PPO | Admitting: Student

## 2021-09-01 VITALS — BP 118/74 | HR 89 | Wt 228.0 lb

## 2021-09-01 DIAGNOSIS — Z1231 Encounter for screening mammogram for malignant neoplasm of breast: Secondary | ICD-10-CM | POA: Diagnosis not present

## 2021-09-01 DIAGNOSIS — L989 Disorder of the skin and subcutaneous tissue, unspecified: Secondary | ICD-10-CM

## 2021-09-01 DIAGNOSIS — Z131 Encounter for screening for diabetes mellitus: Secondary | ICD-10-CM | POA: Diagnosis not present

## 2021-09-01 DIAGNOSIS — N920 Excessive and frequent menstruation with regular cycle: Secondary | ICD-10-CM

## 2021-09-01 DIAGNOSIS — E611 Iron deficiency: Secondary | ICD-10-CM | POA: Diagnosis not present

## 2021-09-01 MED ORDER — PRENATAL VITAMIN PLUS LOW IRON 27-1 MG PO TABS: 1.0000 | ORAL_TABLET | Freq: Every morning | ORAL | 1 refills | Status: DC

## 2021-09-01 NOTE — Progress Notes (Signed)
SUBJECTIVE:   Chief compliant/HPI: annual examination  Robin Duncan is a 41 y.o. who presents today for an annual exam.   Her acute complaints include a growth on her R elbow that has been present for years and is quite bothersome to her and heavy menses since she had her Nexplanon re-placed a few months ago. It is cosmetically bothersome but also gets caught on clothing and can be quite painful when that happens and it tugs/tears at her. Otherwise the lesion is non-painful. Has been present for months-to-years.  She feels her periods have been heavier since getting her Nexplanon replaced a few months ago. Had a similar pattern with initial Nexplanon placement. Is not worried about it, but does endorse some mild fatigue which she thinks may be from her bleeding.   History tabs reviewed and updated.  Mom's sister with BC young. Colon cancer in dad's brother.  Review of systems form reviewed and notable for none.   OBJECTIVE:   BP 118/74   Pulse 89   Wt 228 lb (103.4 kg)   LMP 09/01/2021 (Exact Date)   SpO2 99%   BMI 41.70 kg/m   Physical Exam Vitals reviewed.  Constitutional:      General: She is not in acute distress. Cardiovascular:     Rate and Rhythm: Normal rate and regular rhythm.     Heart sounds: No murmur heard.    No friction rub. No gallop.  Pulmonary:     Effort: Pulmonary effort is normal. No respiratory distress.  Skin:    Comments: 2cm cystic lesion above R elbow as pictured below. Non-painful and without overlying skin changes.   Neurological:     Mental Status: She is alert.       ASSESSMENT/PLAN:   Skin lesion of right arm ~2cm lesion initially suspicious for epidermal inclusion cyst vs lipoma vs large skin tag. As we had time in today's visit, offered removal in clinic to which she was amenable. Informed consent was obtained and signed. After an adequate time out was taken, the area was prepped and draped in the usual fashion. The skin  surrounding the lesion was instilled with 70mL 1%lidocaine with epinephrine. An initial puncture was made using a 71mm punch biopsy revealing the lesion was filled with fatty tissue and not easily expressible fluid within a cystic structure. The procedure was therefore converted into an excisional biopsy. An elliptical incision was made using a #15 scalpel and the lesion was removed, sent for surgical path in a formalin jar. Good hemostasis achieved and incision was closed with four 3-0 Ethilon sutures in a simple interrupted fashion. Area bandaged. Post-incisional biopsy care discussed to which patient was amenable. Will return in one week for suture removal.     Heavy menses Remains regular. Suspect 2/2 Nexplanon. Given mild fatigue and history of anemia/iron deficiency in pregnancy, will check a CBC/Ferritin to monitor. Pt has upcoming OB/Gyn follow-up and will discuss with them as well.     Annual Examination  See AVS for age appropriate recommendations.   PHQ score 3, reviewed and discussed.  Blood pressure reviewed and at goal.  Asked about intimate partner violence and resources given as appropriate  The patient currently uses Nexplanon for contraception.   Considered the following items based upon USPSTF recommendations: Diabetes screening: ordered Screening for elevated cholesterol: discussed HIV testing: discussed Hepatitis C: discussed Hepatitis B: discussed Syphilis if at high risk: discussed GC/CT not at high risk and not ordered. Reviewed risk factors  for latent tuberculosis and not indicated  Cervical cancer screening: prior Pap reviewed, repeat due in 2 years Breast cancer screening: discussed and ordered mammogram based upon personal history  Colorectal cancer screening: not applicable given age.  if age 72 or over.   Follow up in 1 year or sooner if indicated.    Dorothyann Gibbs, MD [facility]

## 2021-09-01 NOTE — Patient Instructions (Addendum)
Ms. Hemmer,  It is so nice to meet you!  I am going to enjoy being your doctor!  We took the spot off of your right arm.  I will send this to a pathology lab and we will call you and let you know what it was.  You may have a little bit of pain at the site, you can take ibuprofen or Tylenol as needed.  Should you develop redness or pus at the site or start having fevers, you should come back and see [place] immediately.  Please keep the original dressing in place for at least 24 hours.  If you redress it, please use the topical antibiotic ointment that we provided on the area.  We will bring you back in about 1 week to remove your stitches. I am ordering your mammogram, you can [facility] to [facility] to make this appointment.  We are also getting some bloodwork to check for diabetes and anemia. I will call you if your results are abnormal or we need to do anything. I will send you a MyChart message if they are normal.   Dorothyann Gibbs, MD

## 2021-09-02 DIAGNOSIS — N920 Excessive and frequent menstruation with regular cycle: Secondary | ICD-10-CM

## 2021-09-02 DIAGNOSIS — L989 Disorder of the skin and subcutaneous tissue, unspecified: Secondary | ICD-10-CM | POA: Insufficient documentation

## 2021-09-02 HISTORY — DX: Excessive and frequent menstruation with regular cycle: N92.0

## 2021-09-02 LAB — CBC
Hematocrit: 38.6 % (ref 34.0–46.6)
Hemoglobin: 12.6 g/dL (ref 11.1–15.9)
MCH: 26.4 pg — ABNORMAL LOW (ref 26.6–33.0)
MCHC: 32.6 g/dL (ref 31.5–35.7)
MCV: 81 fL (ref 79–97)
Platelets: 279 10*3/uL (ref 150–450)
RBC: 4.77 x10E6/uL (ref 3.77–5.28)
RDW: 13.5 % (ref 11.7–15.4)
WBC: 6.7 10*3/uL (ref 3.4–10.8)

## 2021-09-02 LAB — FERRITIN: Ferritin: 114 ng/mL (ref 15–150)

## 2021-09-02 LAB — HEMOGLOBIN A1C
Est. average glucose Bld gHb Est-mCnc: 105 mg/dL
Hgb A1c MFr Bld: 5.3 % (ref 4.8–5.6)

## 2021-09-02 NOTE — Assessment & Plan Note (Addendum)
~  2cm lesion initially suspicious for epidermal inclusion cyst vs lipoma vs large skin tag. As we had time in today's visit, offered removal in clinic to which she was amenable. Informed consent was obtained and signed. After an adequate time out was taken, the area was prepped and draped in the usual fashion. The skin surrounding the lesion was instilled with 25mL 1%lidocaine with epinephrine. An initial puncture was made using a 48mm punch biopsy revealing the lesion was filled with fatty tissue and not easily expressible fluid within a cystic structure. The procedure was therefore converted into an excisional biopsy. An elliptical incision was made using a #15 scalpel and the lesion was removed, sent for surgical path in a formalin jar. Good hemostasis achieved and incision was closed with four 3-0 Ethilon sutures in a simple interrupted fashion. Area bandaged. Post-incisional biopsy care discussed to which patient was amenable. Will return in one week for suture removal.

## 2021-09-02 NOTE — Assessment & Plan Note (Signed)
Remains regular. Suspect 2/2 Nexplanon. Given mild fatigue and history of anemia/iron deficiency in pregnancy, will check a CBC/Ferritin to monitor. Pt has upcoming OB/Gyn follow-up and will discuss with them as well.

## 2021-09-04 ENCOUNTER — Telehealth: Payer: Self-pay

## 2021-09-04 NOTE — Telephone Encounter (Signed)
LMOVM to confirm the appt on 8/15

## 2021-09-09 ENCOUNTER — Ambulatory Visit: Payer: BC Managed Care – PPO | Admitting: Student

## 2021-09-09 ENCOUNTER — Ambulatory Visit: Payer: BC Managed Care – PPO | Admitting: Advanced Practice Midwife

## 2021-09-09 ENCOUNTER — Ambulatory Visit (INDEPENDENT_AMBULATORY_CARE_PROVIDER_SITE_OTHER): Payer: BC Managed Care – PPO

## 2021-09-09 DIAGNOSIS — Z4802 Encounter for removal of sutures: Secondary | ICD-10-CM

## 2021-09-09 MED ORDER — DOXYCYCLINE HYCLATE 100 MG PO TABS: 100.0000 mg | ORAL_TABLET | Freq: Two times a day (BID) | ORAL | 0 refills | Status: AC

## 2021-09-09 NOTE — Progress Notes (Signed)
   SUBJECTIVE:   CHIEF COMPLAINT / HPI:   Robin Duncan is a 41 y.o. female here for asked to see patient by RN. Pt here for suture removal. Pt reports pain at the site of the sutures. She has some pain with movement of her elbow. Denies fever.     PERTINENT  PMH / PSH: reviewed and updated as appropriate   OBJECTIVE:   LMP 09/01/2021 (Exact Date)    GEN: uncomfortable appearing female MSK: full ROM of right elbow SKIN: slight warmth, no fluctuance, buried tight sutures with overlying skin   ASSESSMENT/PLAN:    Pt had 4 sutures placed at [facility] on 8/7 and presented to RN clinic today for removal.  There is some warmth at the site.  Question cellulitis vs small abscess from recent excisional biopsy. Sent doxycycline 5 days to her pharmacy.    Katha Cabal, DO 09/09/2021

## 2021-09-09 NOTE — Progress Notes (Signed)
Patient presents to nurse clinic for suture removal. Patient reports significant pain at site.   Asked preceptor (Dr. Rachael Darby) to evaluate the area. Attempted to remove sutures per Dr. Rachael Darby. I was unable to remove, asked for assistance from Dr. Rachael Darby.   See provider note.   After removal of sutures, applied bacitracin ointment and placed bandage over area.   Return precautions discussed.

## 2021-09-09 NOTE — Patient Instructions (Addendum)
Be sure to take all of your antibiotics.

## 2021-09-10 ENCOUNTER — Ambulatory Visit: Payer: BC Managed Care – PPO

## 2021-10-07 ENCOUNTER — Other Ambulatory Visit: Payer: Self-pay | Admitting: Obstetrics

## 2021-10-07 DIAGNOSIS — Z1231 Encounter for screening mammogram for malignant neoplasm of breast: Secondary | ICD-10-CM

## 2021-10-26 ENCOUNTER — Emergency Department (HOSPITAL_COMMUNITY): Payer: BC Managed Care – PPO

## 2021-10-26 ENCOUNTER — Emergency Department (HOSPITAL_COMMUNITY)
Admission: EM | Admit: 2021-10-26 | Discharge: 2021-10-26 | Disposition: A | Discharge status: Home / Self Care | Payer: BC Managed Care – PPO | Attending: Emergency Medicine | Admitting: Emergency Medicine

## 2021-10-26 DIAGNOSIS — R079 Chest pain, unspecified: Secondary | ICD-10-CM | POA: Diagnosis present

## 2021-10-26 LAB — CBC
HCT: 40.5 % (ref 36.0–46.0)
Hemoglobin: 13.4 g/dL (ref 12.0–15.0)
MCH: 26.6 pg (ref 26.0–34.0)
MCHC: 33.1 g/dL (ref 30.0–36.0)
MCV: 80.4 fL (ref 80.0–100.0)
Platelets: 270 10*3/uL (ref 150–400)
RBC: 5.04 MIL/uL (ref 3.87–5.11)
RDW: 13.4 % (ref 11.5–15.5)
WBC: 6.1 10*3/uL (ref 4.0–10.5)
nRBC: 0 % (ref 0.0–0.2)

## 2021-10-26 LAB — TROPONIN I (HIGH SENSITIVITY)
Troponin I (High Sensitivity): 3 ng/L (ref <18)
Troponin I (High Sensitivity): 3 ng/L (ref <18)

## 2021-10-26 LAB — BASIC METABOLIC PANEL
Anion gap: 10 (ref 5–15)
BUN: 8 mg/dL (ref 6–20)
CO2: 23 mmol/L (ref 22–32)
Calcium: 9.2 mg/dL (ref 8.9–10.3)
Chloride: 107 mmol/L (ref 98–111)
Creatinine, Ser: 0.71 mg/dL (ref 0.44–1.00)
GFR, Estimated: >60 mL/min (ref >60)
Glucose, Bld: 79 mg/dL (ref 70–99)
Potassium: 3.7 mmol/L (ref 3.5–5.1)
Sodium: 140 mmol/L (ref 135–145)

## 2021-10-26 LAB — I-STAT BETA HCG BLOOD, ED (MC, WL, AP ONLY): I-stat hCG, quantitative: <5 m[IU]/mL (ref <5)

## 2021-10-26 NOTE — ED Notes (Signed)
Patient denies any chest pain at this time.  She is alert   no shortness of breath at this time.  Patient family is at the bedside.  She has no hx of cardiac disease.

## 2021-10-26 NOTE — ED Provider Triage Note (Signed)
Emergency Medicine Provider Triage Evaluation Note  Robin Duncan , a 41 y.o. female  was evaluated in triage.  Pt complains of chest pain, shortness of breath that occurred while patient was standing in line at [facility].  Symptoms lasted for about 1 to 2 minutes.  Currently she denies any symptoms and feels back to baseline.  Review of Systems  Positive: As above Negative: As above  Physical Exam  BP 137/87 (BP Location: Right Arm)   Pulse 72   Temp 98.6 F (37 C) (Oral)   Resp 16   SpO2 100%  Gen:   Awake, no distress   Resp:  Normal effort  MSK:   Moves extremities without difficulty  Other:    Medical Decision Making  Medically screening exam initiated at 3:35 PM.  Appropriate orders placed.  Robin Duncan was informed that the remainder of the evaluation will be completed by another provider, this initial triage assessment does not replace that evaluation, and the importance of remaining in the ED until their evaluation is complete.  She was given aspirin in route.   Evlyn Courier, PA-C 10/26/21 1536

## 2021-10-26 NOTE — ED Provider Notes (Signed)
[name] EMERGENCY DEPARTMENT Provider Note   CSN: [id number] Arrival date & time: 10/26/21  1525     History  Chief Complaint  Patient presents with   Chest Pain    Robin Duncan is a 41 y.o. female with sickle cell trait, heavy menses presents with chest pain.   Patient reports that she has had chest pains before but it was not like today. Lasted 2 minutes. Located left upper chest, non-radiating, a/w diaphoresis and SOB. Denies nausea/vomiting. Was in the grocery store when it started. Felt her heart racing until the ambulance came. Received aspirin from EMS. Then the pain and everything just went away. Denies leg swelling, recent surgery/hospitalizations/travel. Does smoke tobacco but not every day. She feels normal right now. No h/o diabetes or MI. Does not take any medicine for anything.  Denies recent fevers chills, cough, illnesses.   Chest Pain      Home Medications Prior to Admission medications   Medication Sig Start Date End Date Taking? Authorizing Provider  Prenatal Vit-Fe Fumarate-FA (PRENATAL VITAMIN PLUS LOW IRON) 27-1 MG TABS Take 1 tablet by mouth every morning. 09/01/21   Alicia Amel, MD      Allergies    Patient has no known allergies.    Review of Systems   Review of Systems  Cardiovascular:  Positive for chest pain.   Review of systems negative for f/c.  A 10 point review of systems was performed and is negative unless otherwise reported in HPI.  Physical Exam Updated Vital Signs BP (!) 139/93   Pulse 66   Temp 98.2 F (36.8 C) (Oral)   Resp 18   LMP  (Exact Date) Comment: Pt states she is on her menstrual cycle today.  SpO2 100%  Physical Exam General: Normal appearing female, lying in bed.  HEENT: PERRLA, Sclera anicteric, MMM, trachea midline. Cardiology: RRR, no murmurs/rubs/gallops. BL radial and DP pulses equal bilaterally.  Resp: Normal respiratory rate and effort. CTAB, no wheezes, rhonchi, crackles.   Abd: Soft, non-tender, non-distended. No rebound tenderness or guarding.  GU: Deferred. MSK: No peripheral edema or signs of trauma.  No erythema, Denna Haggard' sign negative bilaterally.  Extremities without deformity or TTP. No cyanosis or clubbing. Skin: warm, dry. No rashes or lesions. Neuro: A&Ox4, CNs II-XII grossly intact. MAEs. Sensation grossly intact.  Psych: Normal mood and affect.   ED Results / Procedures / Treatments   Labs (all labs ordered are listed, but only abnormal results are displayed) Labs Reviewed  BASIC METABOLIC PANEL  CBC  I-STAT BETA HCG BLOOD, ED ([facility], [facility], [facility] ONLY)  TROPONIN I (HIGH SENSITIVITY)  TROPONIN I (HIGH SENSITIVITY)    EKG EKG Interpretation  Date/Time:  Sunday October 26 2021 15:31:51 EDT Ventricular Rate:  77 PR Interval:  130 QRS Duration: 76 QT Interval:  382 QTC Calculation: 432 R Axis:   32 Text Interpretation: Normal sinus rhythm Normal ECG When compared with ECG of 10-Jan-2021 20:58, PREVIOUS ECG IS PRESENT Confirmed by Vivi Barrack [phone]) on 10/26/2021 8:49:06 PM  Radiology DG Chest 2 View  Result Date: 10/26/2021 CLINICAL DATA:  Chest pain EXAM: CHEST - 2 VIEW COMPARISON:  None Available. FINDINGS: Normal mediastinum and cardiac silhouette. Normal pulmonary vasculature. No evidence of effusion, infiltrate, or pneumothorax. No acute bony abnormality. IMPRESSION: Normal chest radiograph. Electronically Signed   By: Genevive Bi M.D.   On: 10/26/2021 16:37    Procedures Procedures    ED Course/ Medical Decision Making/ A&P  Medical Decision Making Amount and/or Complexity of Data Reviewed Labs: ordered. Radiology: ordered.   Patient is overall very well-appearing, nontoxic, and vitally stable. Her pain has gone and not returned without intervention, only ASA PTA. Patient's exam very reassuring, wnl. No evidence of volume overload. EKG NSR without signs of ischemia. HEART score: 1-2 for smoking and  story suspicion. Sent delta troponin to evaluate for NSTEMI, which was negative. CXR no PNA/PTX/pleural effusion/volume overload. Presentation not consistent with acute PE (Wells low risk  // PERC negative), thoracic arotic dissection, cardiac effusion or tamponade.  I have personally reviewed and interpreted all labs and imaging.   Patient reassured by her normal exam and findings.  Unclear cause of chest pain earlier but I am reassured that it is not an acute life-threatening etiology.  Patient states that she will follow-up with her primary care physician this week and call them in the morning.  Given discharge instructions and return precautions.  Patient discharged in stable condition.  Dispo: DC           Final Clinical Impression(s) / ED Diagnoses Final diagnoses:  Chest pain, unspecified type    Rx / DC Orders ED Discharge Orders     None        This note was created using dictation software, which may contain spelling or grammatical errors.    Audley Hose, MD 10/26/21 2111

## 2021-10-26 NOTE — Discharge Instructions (Signed)
Thank you for coming to [facility] Emergency Department. You were seen for chest pain. We did an exam, labs, and imaging, and these showed no acute findings. Please follow up with your primary care provider within 1 week.   Do not hesitate to return to the ED or call 911 if you experience: -Worsening symptoms -Lightheadedness, passing out -Fevers/chills -Anything else that concerns you

## 2021-10-26 NOTE — ED Triage Notes (Signed)
Patient BIB [facility] from a [facility] on [address] for complaint of sudden onset of chest pain and shortness of breath and standing in line. Patient received 324mg  ASA from EMS PTA. Pain resolved prior to arrival to ED. Patient is alert, oriented, and in no apparent distress at this time.

## 2021-10-28 ENCOUNTER — Ambulatory Visit: Payer: BC Managed Care – PPO

## 2021-11-17 ENCOUNTER — Other Ambulatory Visit (HOSPITAL_COMMUNITY)
Admission: RE | Admit: 2021-11-17 | Discharge: 2021-11-17 | Disposition: A | Discharge status: Home / Self Care | Payer: BC Managed Care – PPO | Source: Ambulatory Visit | Attending: Obstetrics | Admitting: Obstetrics

## 2021-11-17 ENCOUNTER — Encounter: Payer: Self-pay | Admitting: Obstetrics and Gynecology

## 2021-11-17 ENCOUNTER — Ambulatory Visit (INDEPENDENT_AMBULATORY_CARE_PROVIDER_SITE_OTHER): Payer: BC Managed Care – PPO | Admitting: Obstetrics and Gynecology

## 2021-11-17 VITALS — BP 132/81 | HR 88 | Wt 234.0 lb

## 2021-11-17 DIAGNOSIS — Z01419 Encounter for gynecological examination (general) (routine) without abnormal findings: Secondary | ICD-10-CM | POA: Insufficient documentation

## 2021-11-17 NOTE — Patient Instructions (Signed)
[email or web address]  Tubal ligation information provided

## 2021-11-17 NOTE — Progress Notes (Signed)
Pt presents for annual and vaginal STD only. Pt reports cycle lasting from 10/26/21-11/11/21 Negative pap 06/26/20.

## 2021-11-17 NOTE — Progress Notes (Signed)
ANNUAL EXAM Patient name: Robin Duncan MRN [id number]  Date of birth: 1980/04/18 Chief Complaint:   Gynecologic Exam  History of Present Illness:   Robin Duncan is a 41 y.o. [phone]  being seen today for a routine annual exam.  Current complaints: contraception management  Patient currently has Nexplanon in place for contraception.  Patient is interested in tubal ligation, reports prior appointment being associated with special visit and an elevated charge.  Reports not being able to take time off for surgical recovery, as she was recently promoted to a Production designer, theatre/television/film.  Currently having irregular bleeding with Nexplanon in place, but would rather proceed with Nexplanon placed at this time and consider sterilization in the summer.  She is not interested in pills or other forms of contraception at this time  Patient's last menstrual period was 10/26/2021 (exact date).   The pregnancy intention screening data noted above was reviewed. Potential methods of contraception were discussed. The patient elected to proceed with No data recorded.   Last pap 06/2020. Results were: NILM w/ HRHPV negative.  Last mammogram: 09/2019. Results were: normal.      09/01/2021    5:09 PM 08/16/2020    8:39 AM  Depression screen PHQ 2/9  Decreased Interest 0 0  Down, Depressed, Hopeless 0 0  PHQ - 2 Score 0 0  Altered sleeping 0 1  Tired, decreased energy 0 1  Change in appetite 0 0  Feeling bad or failure about yourself  0 0  Trouble concentrating 0 0  Moving slowly or fidgety/restless 0 0  Suicidal thoughts 0 0  PHQ-9 Score 0 2  Difficult doing work/chores Not difficult at all Somewhat difficult        08/23/2018    3:30 PM  GAD 7 : Generalized Anxiety Score  Nervous, Anxious, on Edge 0  Control/stop worrying 0  Worry too much - different things 1  Trouble relaxing 0  Restless 0  Easily annoyed or irritable 0  Afraid - awful might happen 0  Total GAD 7 Score 1  Anxiety Difficulty Not  difficult at all     Review of Systems:   Pertinent items are noted in HPI Denies any headaches, blurred vision, fatigue, shortness of breath, chest pain, abdominal pain, abnormal vaginal discharge/itching/odor/irritation, problems with periods, bowel movements, urination, or intercourse unless otherwise stated above. Pertinent History Reviewed:  Reviewed past medical,surgical, social and family history.  Reviewed problem list, medications and allergies. Physical Assessment:   Vitals:   11/17/21 1501  BP: 132/81  Pulse: 88  Weight: 234 lb (106.1 kg)  Body mass index is 42.8 kg/m.        Physical Examination:   General appearance - well appearing, and in no distress  Mental status - alert, oriented to person, place, and time  Psych:  She has a normal mood and affect  Skin - warm and dry, normal color, no suspicious lesions noted  Chest - effort normal, all lung fields clear to auscultation bilaterally  Heart - normal rate and regular rhythm  Breasts - breasts appear normal, no suspicious masses, no skin or nipple changes or  axillary nodes  Abdomen - soft, nontender, nondistended, no masses or organomegaly  Pelvic - VULVA: normal appearing vulva with no masses, tenderness or lesions  VAGINA: normal appearing vagina with normal color and discharge, no lesions  CERVIX: normal appearing cervix without discharge or lesions, no CMT  Thin prep pap is not indicated  UTERUS: uterus is felt  to be normal size, shape, consistency and nontender   ADNEXA: No adnexal masses or tenderness noted.  Extremities:  No swelling or varicosities noted  Chaperone present for exam  No results found for this or any previous visit (from the past 24 hour(s)).  Assessment & Plan:  1) Well-Woman Exam Completed examination today.  Briefly reviewed permanent sterilization, vasectomy, other forms of contraception.  Also reviewed additional options for managing menses, including endometrial ablation,  contraceptives, etc.  We will think on her options more at this time. Declines serum STI testing  No orders of the defined types were placed in this encounter.   Meds: No orders of the defined types were placed in this encounter.   Follow-up: Return in about 1 year (around 11/18/2022) for Annual GYN.  Darliss Cheney, MD 11/17/2021 6:46 PM

## 2021-11-18 ENCOUNTER — Ambulatory Visit
Admission: RE | Admit: 2021-11-18 | Discharge: 2021-11-18 | Disposition: A | Discharge status: Home / Self Care | Payer: BC Managed Care – PPO | Source: Ambulatory Visit | Attending: Family Medicine | Admitting: Family Medicine

## 2021-11-18 DIAGNOSIS — Z1231 Encounter for screening mammogram for malignant neoplasm of breast: Secondary | ICD-10-CM

## 2021-11-18 LAB — CERVICOVAGINAL ANCILLARY ONLY
Chlamydia: NEGATIVE
Comment: NEGATIVE
Comment: NEGATIVE
Comment: NORMAL
Neisseria Gonorrhea: NEGATIVE
Trichomonas: POSITIVE — AB

## 2021-11-19 ENCOUNTER — Other Ambulatory Visit: Payer: Self-pay | Admitting: Obstetrics and Gynecology

## 2022-02-04 ENCOUNTER — Other Ambulatory Visit: Payer: Self-pay | Admitting: *Deleted

## 2022-02-04 DIAGNOSIS — A599 Trichomoniasis, unspecified: Secondary | ICD-10-CM

## 2022-02-04 MED ORDER — METRONIDAZOLE 500 MG PO TABS: 500.0000 mg | ORAL_TABLET | Freq: Two times a day (BID) | ORAL | 0 refills | Status: DC

## 2022-02-04 NOTE — Progress Notes (Signed)
Pt advised of Trichomonas infection and RX sent. Apology offered for delay in notification and treatment. Advised of need for partner(s) treatment and no unprotected sex during [place] or for 7 days after.

## 2022-04-13 ENCOUNTER — Encounter (HOSPITAL_COMMUNITY): Payer: Self-pay | Admitting: Emergency Medicine

## 2022-04-13 ENCOUNTER — Ambulatory Visit (HOSPITAL_COMMUNITY)
Admission: EM | Admit: 2022-04-13 | Discharge: 2022-04-13 | Disposition: A | Discharge status: Home / Self Care | Payer: BC Managed Care – PPO | Attending: Family Medicine | Admitting: Family Medicine

## 2022-04-13 DIAGNOSIS — J069 Acute upper respiratory infection, unspecified: Secondary | ICD-10-CM

## 2022-04-13 DIAGNOSIS — U071 COVID-19: Secondary | ICD-10-CM | POA: Diagnosis not present

## 2022-04-13 DIAGNOSIS — D573 Sickle-cell trait: Secondary | ICD-10-CM | POA: Insufficient documentation

## 2022-04-13 MED ORDER — IBUPROFEN 800 MG PO TABS: 800.0000 mg | ORAL_TABLET | Freq: Three times a day (TID) | ORAL | 0 refills | Status: DC | PRN

## 2022-04-13 MED ORDER — BENZONATATE 100 MG PO CAPS: 100.0000 mg | ORAL_CAPSULE | Freq: Three times a day (TID) | ORAL | 0 refills | Status: DC | PRN

## 2022-04-13 NOTE — Discharge Instructions (Signed)
Take benzonatate 100 mg, 1 tab every 8 hours as needed for cough.  Take ibuprofen 800 mg--1 tab every 8 hours as needed for pain.  Mucinex D may be more helpful for your congestion.    You have been swabbed for COVID, and the test will result in the next 24 hours. Our staff will call you if positive. If the COVID test is positive, you should quarantine until you are fever free for 24 hours and you are starting to feel better, and then take added precautions for the next 5 days, such as physical distancing/wearing a mask and good hand hygiene/washing.

## 2022-04-13 NOTE — ED Triage Notes (Signed)
Started yesterday having  can't smell, congestion, cough, body aching. Pt took Goody powder and tylenol

## 2022-04-13 NOTE — ED Provider Notes (Signed)
[facility]    CSN: [id number] Arrival date & time: 04/13/22  1610      History   Chief Complaint No chief complaint on file.   HPI Robin Duncan is a 42 y.o. female.   HPI Here for congestion and rhinorrhea and cough and malaise.  Symptoms began yesterday.  The myalgia began today.  She has not noted any fever but she has had some chills at home.  No nausea or vomiting but she has had some diarrhea.  She has been unable to smell since yesterday.  Past Medical History:  Diagnosis Date   Bursitis    Pregnancy induced hypertension    Sickle cell trait ([place])    Vaginal Pap smear, abnormal     Patient Active Problem List   Diagnosis Date Noted   Skin lesion of right arm 09/02/2021   Heavy menses 09/02/2021   Encounter for removal and reinsertion of Nexplanon 11/04/2020   Sickle cell trait ([place]) 02/18/2017   Vitamin D deficiency 02/18/2017   History of pregnancy induced hypertension 02/15/2017    Past Surgical History:  Procedure Laterality Date   leep      OB History     Gravida  4   Para  3   Term  3   Preterm  0   AB  1   Living  3      SAB      IAB      Ectopic      Multiple  0   Live Births  3            Home Medications    Prior to Admission medications   Medication Sig Start Date End Date Taking? Authorizing Provider  benzonatate (TESSALON) 100 MG capsule Take 1 capsule (100 mg total) by mouth 3 (three) times daily as needed for cough. 04/13/22  Yes Barrett Henle, MD  ibuprofen (ADVIL) 800 MG tablet Take 1 tablet (800 mg total) by mouth every 8 (eight) hours as needed (pain). 04/13/22  Yes Barrett Henle, MD    Family History Family History  Problem Relation Age of Onset   Diabetes Mother    Hyperlipidemia Father    Hypertension Father    Thyroid disease Sister    Hypertension Sister    Diabetes Sister    Alcohol abuse Sister    Alcohol abuse Brother    Breast cancer Other    Colon cancer Other      Social History Social History   Tobacco Use   Smoking status: Some Days    Packs/day: .1    Types: Cigarettes    Last attempt to quit: 09/07/2015    Years since quitting: 6.6   Smokeless tobacco: Never  Vaping Use   Vaping Use: Some days  Substance Use Topics   Alcohol use: Yes    Alcohol/week: 0.0 standard drinks of alcohol    Comment: socially    Drug use: No     Allergies   Patient has no known allergies.   Review of Systems Review of Systems   Physical Exam Triage Vital Signs ED Triage Vitals  Enc Vitals Group     BP 04/13/22 1746 (!) 142/86     Pulse Rate 04/13/22 1746 99     Resp 04/13/22 1746 18     Temp 04/13/22 1746 99.4 F (37.4 C)     Temp Source 04/13/22 1746 Oral     SpO2 04/13/22 1746 96 %  Weight --      Height --      Head Circumference --      Peak Flow --      Pain Score 04/13/22 1745 8     Pain Loc --      Pain Edu? --      Excl. in [place]? --    No data found.  Updated Vital Signs BP (!) 142/86 (BP Location: Left Arm)   Pulse 99   Temp 99.4 F (37.4 C) (Oral)   Resp 18   SpO2 96%   Visual Acuity Right Eye Distance:   Left Eye Distance:   Bilateral Distance:    Right Eye Near:   Left Eye Near:    Bilateral Near:     Physical Exam Vitals reviewed.  Constitutional:      General: She is not in acute distress.    Appearance: She is not toxic-appearing.  HENT:     Right Ear: Tympanic membrane and ear canal normal.     Left Ear: Tympanic membrane and ear canal normal.     Nose: Congestion present.     Mouth/Throat:     Mouth: Mucous membranes are moist.     Comments: There is white and clear mucus draining in the oropharynx.  No erythema Eyes:     Extraocular Movements: Extraocular movements intact.     Conjunctiva/sclera: Conjunctivae normal.     Pupils: Pupils are equal, round, and reactive to light.  Cardiovascular:     Rate and Rhythm: Normal rate and regular rhythm.     Heart sounds: No murmur  heard. Pulmonary:     Effort: Pulmonary effort is normal. No respiratory distress.     Breath sounds: No stridor. No wheezing, rhonchi or rales.  Musculoskeletal:     Cervical back: Neck supple.  Lymphadenopathy:     Cervical: No cervical adenopathy.  Skin:    Capillary Refill: Capillary refill takes less than 2 seconds.     Coloration: Skin is not jaundiced or pale.  Neurological:     General: No focal deficit present.     Mental Status: She is alert and oriented to person, place, and time.  Psychiatric:        Behavior: Behavior normal.      UC Treatments / Results  Labs (all labs ordered are listed, but only abnormal results are displayed) Labs Reviewed  SARS CORONAVIRUS 2 (TAT 6-24 HRS)    EKG   Radiology No results found.  Procedures Procedures (including critical care time)  Medications Ordered in UC Medications - No data to display  Initial Impression / Assessment and Plan / UC Course  I have reviewed the triage vital signs and the nursing notes.  Pertinent labs & imaging results that were available during my care of the patient were reviewed by me and considered in my medical decision making (see chart for details).        Ibuprofen 800 mg is sent in for her aching.  Tessalon Perles are sent in for the cough.  I have recommended Mucinex D over-the-counter as needed for the congestion.  She is swabbed for COVID and if positive, she is a candidate for Paxlovid.  Her last EGFR in 2023 was greater than 60. Final Clinical Impressions(s) / UC Diagnoses   Final diagnoses:  Viral URI with cough     Discharge Instructions      Take benzonatate 100 mg, 1 tab every 8 hours as needed for cough.  Take ibuprofen 800 mg--1 tab every 8 hours as needed for pain.  Mucinex D may be more helpful for your congestion.    You have been swabbed for COVID, and the test will result in the next 24 hours. Our staff will call you if positive. If the COVID test is  positive, you should quarantine until you are fever free for 24 hours and you are starting to feel better, and then take added precautions for the next 5 days, such as physical distancing/wearing a mask and good hand hygiene/washing.      ED Prescriptions     Medication Sig Dispense Auth. Provider   benzonatate (TESSALON) 100 MG capsule Take 1 capsule (100 mg total) by mouth 3 (three) times daily as needed for cough. 21 capsule Barrett Henle, MD   ibuprofen (ADVIL) 800 MG tablet Take 1 tablet (800 mg total) by mouth every 8 (eight) hours as needed (pain). 21 tablet Meryle Pugmire, Gwenlyn Perking, MD      PDMP not reviewed this encounter.   Barrett Henle, MD 04/13/22 [phone]

## 2022-04-14 LAB — SARS CORONAVIRUS 2 (TAT 6-24 HRS): SARS Coronavirus 2: POSITIVE — AB

## 2022-09-07 ENCOUNTER — Encounter: Payer: Self-pay | Admitting: Student

## 2022-09-07 ENCOUNTER — Ambulatory Visit (INDEPENDENT_AMBULATORY_CARE_PROVIDER_SITE_OTHER): Payer: BC Managed Care – PPO | Admitting: Student

## 2022-09-07 ENCOUNTER — Other Ambulatory Visit: Payer: Self-pay

## 2022-09-07 VITALS — BP 126/88 | HR 90 | Ht 62.0 in | Wt 243.2 lb

## 2022-09-07 DIAGNOSIS — K5900 Constipation, unspecified: Secondary | ICD-10-CM | POA: Diagnosis not present

## 2022-09-07 DIAGNOSIS — Z0000 Encounter for general adult medical examination without abnormal findings: Secondary | ICD-10-CM | POA: Diagnosis not present

## 2022-09-07 DIAGNOSIS — Z6841 Body Mass Index (BMI) 40.0 and over, adult: Secondary | ICD-10-CM | POA: Diagnosis not present

## 2022-09-07 MED ORDER — POLYETHYLENE GLYCOL 3350 17 GM/SCOOP PO POWD: 17.0000 g | Freq: Every day | ORAL | 3 refills | Status: AC

## 2022-09-07 NOTE — Patient Instructions (Addendum)
I want you to put those 20lb dumbells to good use. Next time I see you, I want to hear that you went out and bought 35s.   I'm okay with the carnivore diet for short term weight loss. But let's check back in a month. There are probably some risks of eating this way long term and most folks find it's not sustainable.   See you in 1 months.    Here's a guide to how much exercise the body needs. These should be minutes of dedicated moderate-difficulty cardiovascular exercise. Hiking, jogging, swimming, biking, rucking, or pilates are all good options. As you're starting out, a brisk walk is likely enough of a stress on your body, but as you adapt, the exercises should become more challenging.  150 min/week to maintain and improve cardiovascular health 150-250 min/week to prevent weight gain 225-420 min/week (1 hr/day!) to promote meaningful weight loss 200-300 min/week to prevent recurrent weight gain after loss   In addition, resistance exercises such as working out with resistance bands/weights/weight machines or calisthenics is a MUST. These exercises should be done twice weekly at minimum and the weights should get heavier with time.     Eliezer Mccoy, MD

## 2022-09-07 NOTE — Progress Notes (Unsigned)
    SUBJECTIVE:   Chief compliant/HPI: annual examination  Robin Duncan is a 42 y.o. who presents today for an annual exam.  Constipated Hard, painful. "Using the bathroom is like going to hell." Did start fiber pills and probiotics.   Desires Weight Loss   History tabs reviewed and updated ***.   Quit smoking 3 years ago.     Review of systems form reviewed and notable for ***.   OBJECTIVE:   BP (!) 133/92   Pulse (!) 105   Ht 5\' 2"  (1.575 m)   Wt 243 lb 3.2 oz (110.3 kg)   LMP 08/27/2022   SpO2 99%   BMI 44.48 kg/m   ***  ASSESSMENT/PLAN:   No problem-specific Assessment & Plan notes found for this encounter.    Annual Examination  See AVS for age appropriate recommendations.   PHQ score ***, reviewed and discussed.  Blood pressure reviewed and at goal ***.  Asked about intimate partner violence and resources given as appropriate  The patient currently uses *** for contraception. Folate recommended as appropriate, minimum of 400 mcg per day.   Considered the following items based upon USPSTF recommendations: Diabetes screening: {discussed/ordered:14545} Screening for elevated cholesterol: {discussed/ordered:14545} HIV testing: {discussed/ordered:14545} Hepatitis C: {discussed/ordered:14545} Hepatitis B: {discussed/ordered:14545} Syphilis if at high risk: {discussed/ordered:14545} GC/CT {GC/CT screening :23818} Reviewed risk factors for latent tuberculosis and {not indicated/requested/declined:14582} Reviewed risk factors for osteoporosis. Using FRAX tool estimated risk of major osteoporotic fracture of  ***%, early screening {ordered not order:23822::"not ordered"}   Discussed family history, BRCA testing {not indicated/requested/declined:14582}. Tool used to risk stratify was ***.  Cervical cancer screening: prior Pap reviewed, repeat due in 2027 Breast cancer screening: {mammoscreen:23820} Colorectal cancer screening: {crcscreen:23821::"discussed,  colonoscopy ordered"} if age 61 or over.   Follow up in 1 *** year or sooner if indicated.    Eliezer Mccoy, MD [facility]

## 2022-09-09 ENCOUNTER — Encounter: Payer: Self-pay | Admitting: Student

## 2022-09-09 NOTE — Assessment & Plan Note (Signed)
Will add daily Miralax. Expect she will see a change in her bowel habits with the transition to the carnivore diet as well---this could go either way.

## 2022-09-09 NOTE — Assessment & Plan Note (Addendum)
Desires weight loss. Going to trial a carnivore diet. Anticipate she will have a fair amount of weight loss which should help her gather some momentum which she can harness as she transitions to a more sustainable way of eating.  Also had a long discussion about the benefits of weight training. She has access to dumbbells. Will introduce this at least twice per week.

## 2022-10-06 ENCOUNTER — Ambulatory Visit: Payer: Self-pay | Admitting: Student

## 2022-11-03 ENCOUNTER — Ambulatory Visit: Payer: BC Managed Care – PPO | Admitting: Student

## 2023-01-13 ENCOUNTER — Ambulatory Visit (INDEPENDENT_AMBULATORY_CARE_PROVIDER_SITE_OTHER): Payer: BC Managed Care – PPO | Admitting: Family Medicine

## 2023-01-13 VITALS — BP 135/80 | HR 97 | Temp 97.7°F

## 2023-01-13 DIAGNOSIS — S39012A Strain of muscle, fascia and tendon of lower back, initial encounter: Secondary | ICD-10-CM | POA: Insufficient documentation

## 2023-01-13 MED ORDER — MELOXICAM 15 MG PO TABS: 15.0000 mg | ORAL_TABLET | Freq: Every day | ORAL | 0 refills | Status: DC

## 2023-01-13 NOTE — Patient Instructions (Addendum)
You have a paraspinal lumbar muscle strain.  I have sent in a medication called meloxicam to take daily for 7 days to help with pain and inflammation.  I have also given you some back exercises to try at home to try and stretch those muscles.  Let me know if the pain worsens or is more constant while on this therapy so we can further evaluate.

## 2023-01-13 NOTE — Assessment & Plan Note (Signed)
As evidenced on history and exam.  Reassuring neurologic exam.  Less likely renal pathology given pain to palpation and movement. Will send in meloxicam 15 mg daily for 7 days.  Provided home back exercises to complete.  Can consider lidocaine patches and formal physical therapy if not helpful.  Advised to contact us should pain worsen or not improve.

## 2023-01-13 NOTE — Progress Notes (Signed)
    SUBJECTIVE:   CHIEF COMPLAINT / HPI:   Back pain 2-week history of left lower back pain.  Painful to palpation and also with certain movements.  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  No known trauma preceding pain.  No rashes.  No motor or sensory changes in the bilateral lower extremities.  No dysuria.  No deep abdominal pain that radiates to the groin.  She does mention that she drinks a lot of tea and not as much water as she should.  PERTINENT  PMH / PSH: Vitamin D deficiency, sickle cell trait  OBJECTIVE:   BP 135/80   Pulse 97   Temp 97.7 F (36.5 C)   SpO2 97%   General: Alert and oriented, in NAD Skin: Warm, dry, and intact without lesions or rashes HEENT: NCAT, EOM grossly normal, midline nasal septum Cardiac: Regular rate Respiratory/Back: Breathing and speaking comfortably on RA, pain to palpation over L>R paraspinal muscles, no spinal tenderness Neurological/Musculoskeletal: Bilateral lower extremities with 5 out of 5 strength and sensation intact, gait normal, straight leg raise negative though does reproduce lower back pain Psychiatric: Appropriate mood and affect   ASSESSMENT/PLAN:   Strain of lumbar paraspinal muscle As evidenced on history and exam.  Reassuring neurologic exam.  Less likely renal pathology given pain to palpation and movement. Will send in meloxicam 15 mg daily for 7 days.  Provided home back exercises to complete.  Can consider lidocaine patches and formal physical therapy if not helpful.  Advised to contact us should pain worsen or not improve.   Janeal Holmes, MD [facility]

## 2023-01-25 ENCOUNTER — Ambulatory Visit: Payer: BC Managed Care – PPO | Admitting: Student

## 2023-01-25 ENCOUNTER — Encounter: Payer: Self-pay | Admitting: Student

## 2023-01-25 VITALS — BP 119/71 | HR 100 | Ht 62.0 in

## 2023-01-25 DIAGNOSIS — N3001 Acute cystitis with hematuria: Secondary | ICD-10-CM | POA: Diagnosis not present

## 2023-01-25 DIAGNOSIS — R300 Dysuria: Secondary | ICD-10-CM

## 2023-01-25 LAB — POCT URINALYSIS DIP (MANUAL ENTRY)
Bilirubin, UA: NEGATIVE
Glucose, UA: NEGATIVE mg/dL
Ketones, POC UA: NEGATIVE mg/dL
Nitrite, UA: NEGATIVE
Protein Ur, POC: 30 mg/dL — AB
Spec Grav, UA: 1.015 (ref 1.010–1.025)
Urobilinogen, UA: 0.2 U/dL
pH, UA: 5.5 (ref 5.0–8.0)

## 2023-01-25 LAB — POCT UA - MICROSCOPIC ONLY
RBC, Urine, Miroscopic: >20 (ref 0–2)
WBC, Ur, HPF, POC: >20 (ref 0–5)

## 2023-01-25 MED ORDER — IBUPROFEN 800 MG PO TABS: 800.0000 mg | ORAL_TABLET | Freq: Three times a day (TID) | ORAL | 0 refills | Status: AC | PRN

## 2023-01-25 MED ORDER — CEFADROXIL 500 MG PO CAPS: 500.0000 mg | ORAL_CAPSULE | Freq: Two times a day (BID) | ORAL | 0 refills | Status: AC

## 2023-01-25 NOTE — Progress Notes (Cosign Needed)
    SUBJECTIVE:   CHIEF COMPLAINT / HPI:   Low back pain Previously seen on 01/13/2019 for lower back pain, worse on left.  At that time she was not having urinary symptoms no abdominal pain.  She now reports that she has increased abdominal pain and dysuria.  Increased frequency.   OBJECTIVE:   BP 119/71   Pulse 100   Ht 5\' 2"  (1.575 m)   LMP 12/27/2022   SpO2 100%   BMI 44.48 kg/m    General: NAD, pleasant Cardio: RRR, no MRG. Cap Refill <2s. Respiratory: CTAB, normal wob on RA GI: Abdomen is soft, not tender, not distended. BS present Skin: Warm and dry  ASSESSMENT/PLAN:   Assessment & Plan Acute cystitis with hematuria Suspect low back pain and dysuria 2/2 UTI as evidenced by UA.  UA shows moderate blood, moderate leukocytes.  Microscopy showing moderate bacteria. - Cefadroxil 500 mg daily for 5 days - Will send culture - Follow-up if symptoms fail to improve or worsen - Per patient preference, sent prescription for 800 mg ibuprofen every 8 hours (5-day supply)    Tiffany Kocher, DO [facility]

## 2023-01-25 NOTE — Patient Instructions (Signed)
It was great to see you! Thank you for allowing me to participate in your care!   I recommend that you always bring your medications to each appointment as this makes it easy to ensure we are on the correct medications and helps [place] not miss when refills are needed.  Our plans for today:  -Take 800 mg ibuprofen every 8 hours as needed for pain -Take 500 mg cefadroxil twice daily for urinary tract infection -If symptoms fail to improve or worsen please return to care  Take care and seek immediate care sooner if you develop any concerns. Please remember to show up 15 minutes before your scheduled appointment time!  Tiffany Kocher, DO [facility]

## 2023-01-27 LAB — URINE CULTURE

## 2023-02-05 ENCOUNTER — Ambulatory Visit: Payer: BC Managed Care – PPO | Admitting: Obstetrics

## 2023-03-10 ENCOUNTER — Other Ambulatory Visit (HOSPITAL_COMMUNITY)
Admission: RE | Admit: 2023-03-10 | Discharge: 2023-03-10 | Disposition: A | Discharge status: Home / Self Care | Payer: Medicaid Other | Source: Ambulatory Visit | Attending: Obstetrics | Admitting: Obstetrics

## 2023-03-10 ENCOUNTER — Ambulatory Visit: Payer: Medicaid Other | Admitting: Obstetrics

## 2023-03-10 ENCOUNTER — Encounter: Payer: Self-pay | Admitting: Obstetrics

## 2023-03-10 VITALS — BP 136/94 | HR 97 | Wt 243.0 lb

## 2023-03-10 DIAGNOSIS — E6609 Other obesity due to excess calories: Secondary | ICD-10-CM

## 2023-03-10 DIAGNOSIS — K5901 Slow transit constipation: Secondary | ICD-10-CM

## 2023-03-10 DIAGNOSIS — N898 Other specified noninflammatory disorders of vagina: Secondary | ICD-10-CM | POA: Insufficient documentation

## 2023-03-10 DIAGNOSIS — Z01419 Encounter for gynecological examination (general) (routine) without abnormal findings: Secondary | ICD-10-CM | POA: Insufficient documentation

## 2023-03-10 DIAGNOSIS — Z1239 Encounter for other screening for malignant neoplasm of breast: Secondary | ICD-10-CM

## 2023-03-10 MED ORDER — VITAFOL ULTRA 29-0.6-0.4-200 MG PO CAPS: 1.0000 | ORAL_CAPSULE | Freq: Every day | ORAL | 4 refills | Status: DC

## 2023-03-10 MED ORDER — DICYCLOMINE HCL 20 MG PO TABS: 20.0000 mg | ORAL_TABLET | Freq: Three times a day (TID) | ORAL | 11 refills | Status: DC

## 2023-03-10 NOTE — Progress Notes (Signed)
Pt presents for AEX. Pap today, ordered mammogram. Last one was in 2023. Nexplanon in place.

## 2023-03-10 NOTE — Progress Notes (Signed)
Subjective:        Robin Duncan is a 43 y.o. female here for a routine exam.  Current complaints: Constipation.  Also has vaginal discharge.without odor or irritation.   Personal health questionnaire:  Is patient Ashkenazi Jewish, have a family history of breast and/or ovarian cancer: no Is there a family history of uterine cancer diagnosed at age < 71, gastrointestinal cancer, urinary tract cancer, family member who is a Personnel officer syndrome-associated carrier: no Is the patient overweight and hypertensive, family history of diabetes, personal history of gestational diabetes, preeclampsia or PCOS: yes Is patient over 2, have PCOS,  family history of premature CHD under age 34, diabetes, smoke, have hypertension or peripheral artery disease:  no At any time, has a partner hit, kicked or otherwise hurt or frightened you?: no Over the past 2 weeks, have you felt down, depressed or hopeless?: no Over the past 2 weeks, have you felt little interest or pleasure in doing things?:no   Gynecologic History No LMP recorded. Patient has had an implant. Contraception: abstinence, Nexplanon Last Pap: 2022. Results were: normal Last mammogram: 2023. Results were: normal  Obstetric History OB History  Gravida Para Term Preterm AB Living  4 3 3  0 1 3  SAB IAB Ectopic Multiple Live Births     0 3    # Outcome Date GA Lbr Len/2nd Weight Sex Type Anes PTL Lv  4 Term 09/15/17 [redacted]w[redacted]d 04:36 / 00:01 7 lb 9.2 oz (3.436 kg) M Vag-Spont EPI  LIV  3 AB           2 Term      Vag-Spont     1 Term      Vag-Spont       Past Medical History:  Diagnosis Date   Bursitis    Heavy menses 09/02/2021   Pregnancy induced hypertension    Sickle cell trait (HCC)    Vaginal Pap smear, abnormal     Past Surgical History:  Procedure Laterality Date   leep       Current Outpatient Medications:    dicyclomine (BENTYL) 20 MG tablet, Take 1 tablet (20 mg total) by mouth 3 (three) times daily before meals.,  Disp: 90 tablet, Rfl: 11   meloxicam (MOBIC) 15 MG tablet, Take 1 tablet (15 mg total) by mouth daily., Disp: 7 tablet, Rfl: 0   polyethylene glycol powder (GLYCOLAX/MIRALAX) 17 GM/SCOOP powder, Take 17 g by mouth daily., Disp: 850 g, Rfl: 3   Prenat-Fe Poly-Methfol-FA-DHA (VITAFOL ULTRA) 29-0.6-0.4-200 MG CAPS, Take 1 capsule by mouth daily before breakfast., Disp: 90 capsule, Rfl: 4   benzonatate (TESSALON) 100 MG capsule, Take 1 capsule (100 mg total) by mouth 3 (three) times daily as needed for cough. (Patient not taking: Reported on 03/10/2023), Disp: 21 capsule, Rfl: 0 No Known Allergies  Social History   Tobacco Use   Smoking status: Some Days    Current packs/day: 0.00    Types: Cigarettes    Last attempt to quit: 09/07/2015    Years since quitting: 7.5   Smokeless tobacco: Never  Substance Use Topics   Alcohol use: Yes    Alcohol/week: 0.0 standard drinks of alcohol    Comment: socially     Family History  Problem Relation Age of Onset   Diabetes Mother    Hyperlipidemia Father    Hypertension Father    Thyroid disease Sister    Hypertension Sister    Diabetes Sister    Alcohol abuse Sister  Alcohol abuse Brother    Breast cancer Other    Colon cancer Other       Review of Systems  Constitutional: negative for fatigue and weight loss Respiratory: negative for cough and wheezing Cardiovascular: negative for chest pain, fatigue and palpitations Gastrointestinal: negative for abdominal pain and change in bowel habits Musculoskeletal:negative for myalgias Neurological: negative for gait problems and tremors Behavioral/Psych: negative for abusive relationship, depression Endocrine: negative for temperature intolerance    Genitourinary:negative for abnormal menstrual periods, genital lesions, hot flashes, sexual problems and vaginal discharge Integument/breast: negative for breast lump, breast tenderness, nipple discharge and skin lesion(s)    Objective:        BP (!) 136/94   Pulse 97   Wt 243 lb (110.2 kg)   BMI 44.45 kg/m  General:   alert  Skin:   no rash or abnormalities  Lungs:   clear to auscultation bilaterally  Heart:   regular rate and rhythm, S1, S2 normal, no murmur, click, rub or gallop  Breasts:   normal without suspicious masses, skin or nipple changes or axillary nodes  Abdomen:  normal findings: no organomegaly, soft, non-tender and no hernia  Pelvis:  External genitalia: normal general appearance Urinary system: urethral meatus normal and bladder without fullness, nontender Vaginal: normal without tenderness, induration or masses Cervix: normal appearance Adnexa: normal bimanual exam Uterus: anteverted and non-tender, normal size   Lab Review Urine pregnancy test Labs reviewed yes Radiologic studies reviewed yes  I have spent a total of 30 minutes of face-to-face time, excluding clinical staff time, reviewing notes and preparing to see patient, ordering tests and/or medications, and counseling the patient.   Assessment:    1. Encounter for gynecological examination with Papanicolaou smear of cervix (Primary) Rx: - Cytology - PAP( [facility]) - Prenat-Fe Poly-Methfol-FA-DHA (VITAFOL ULTRA) 29-0.6-0.4-200 MG CAPS; Take 1 capsule by mouth daily before breakfast.  Dispense: 90 capsule; Refill: 4  2. Vaginal discharge Rx: - Cervicovaginal ancillary only( [facility])  3. Obesity due to excess calories without serious comorbidity, unspecified class Rx: - TSH  4. Slow transit constipation Rx: - dicyclomine (BENTYL) 20 MG tablet; Take 1 tablet (20 mg total) by mouth 3 (three) times daily before meals.  Dispense: 90 tablet; Refill: 11  5. Screening breast examination Rx: - MM 3D SCREENING MAMMOGRAM BILATERAL BREAST; Future     Plan:    Education reviewed: calcium supplements, depression evaluation, low fat, low cholesterol diet, safe sex/STD prevention, self breast exams, and weight bearing  exercise. Contraception: abstinence and Nexplanon. Mammogram ordered. Follow up in: 1 year.   Meds ordered this encounter  Medications   Prenat-Fe Poly-Methfol-FA-DHA (VITAFOL ULTRA) 29-0.6-0.4-200 MG CAPS    Sig: Take 1 capsule by mouth daily before breakfast.    Dispense:  90 capsule    Refill:  4   dicyclomine (BENTYL) 20 MG tablet    Sig: Take 1 tablet (20 mg total) by mouth 3 (three) times daily before meals.    Dispense:  90 tablet    Refill:  11   Orders Placed This Encounter  Procedures   MM 3D SCREENING MAMMOGRAM BILATERAL BREAST    MCD #[id number] 11/18/21// 11/18/21 [facility] //NO CURRENT BREAST PROBLEMS / NO SPECIAL NEEDS /  NO IMPLANTS OR REDUCTION // NO HX BRCA/PT AWARE OF $75.OO CANCELLATION POLICY MGO WITH PROVIDER    Standing Status:   Future    Expiration Date:   03/09/2024    Reason for Exam (SYMPTOM  OR DIAGNOSIS REQUIRED):  Screening    Is the patient pregnant?:   No    Preferred imaging location?:   [facility]   TSH     Brock Bad, MD, FACOG Attending Obstetrician & Gynecologist, [facility] for [facility], [facility], [place] 03/09/2023

## 2023-03-11 LAB — CERVICOVAGINAL ANCILLARY ONLY
Bacterial Vaginitis (gardnerella): NEGATIVE
Candida Glabrata: NEGATIVE
Candida Vaginitis: NEGATIVE
Chlamydia: NEGATIVE
Comment: NEGATIVE
Comment: NEGATIVE
Comment: NEGATIVE
Comment: NEGATIVE
Comment: NEGATIVE
Comment: NORMAL
Neisseria Gonorrhea: NEGATIVE
Trichomonas: NEGATIVE

## 2023-03-12 LAB — CYTOLOGY - PAP
Comment: NEGATIVE
Diagnosis: NEGATIVE
High risk HPV: NEGATIVE

## 2023-03-15 ENCOUNTER — Encounter: Payer: Self-pay | Admitting: Family Medicine

## 2023-03-19 ENCOUNTER — Ambulatory Visit: Payer: Medicaid Other

## 2023-03-30 ENCOUNTER — Ambulatory Visit
Admission: RE | Admit: 2023-03-30 | Discharge: 2023-03-30 | Disposition: A | Discharge status: Home / Self Care | Payer: Medicaid Other | Source: Ambulatory Visit | Attending: Obstetrics | Admitting: Obstetrics

## 2023-03-30 DIAGNOSIS — Z1239 Encounter for other screening for malignant neoplasm of breast: Secondary | ICD-10-CM

## 2023-07-06 ENCOUNTER — Encounter: Payer: Self-pay | Admitting: *Deleted

## 2023-08-13 ENCOUNTER — Encounter: Payer: Self-pay | Admitting: Advanced Practice Midwife

## 2023-11-10 ENCOUNTER — Ambulatory Visit: Admitting: Obstetrics & Gynecology

## 2023-11-10 ENCOUNTER — Encounter: Payer: Self-pay | Admitting: Obstetrics & Gynecology

## 2023-11-10 VITALS — BP 127/87 | HR 101 | Ht 63.0 in | Wt 242.0 lb

## 2023-11-10 DIAGNOSIS — Z3046 Encounter for surveillance of implantable subdermal contraceptive: Secondary | ICD-10-CM | POA: Diagnosis not present

## 2023-11-10 MED ORDER — ETONOGESTREL 68 MG ~~LOC~~ IMPL
68.0000 mg | DRUG_IMPLANT | Freq: Once | SUBCUTANEOUS | Status: AC
  Administered 2023-11-10: 68 mg via SUBCUTANEOUS

## 2023-11-10 NOTE — Progress Notes (Signed)
 GYNECOLOGY OFFICE PROCEDURE NOTE  Robin Duncan is a 43 y.o. [phone] here for Nexplanon  removal and Nexplanon  insertion.  Last pap smear was on 02/2023 and was normal.  No other gynecologic concerns.   Nexplanon  Removal and Insertion  Patient identified, informed consent performed, consent signed.   Patient does understand that irregular bleeding is a very common side effect of this medication. She was advised to have backup contraception for one week after replacement of the implant. Pregnancy test in clinic today was negative.  Appropriate time out taken. Implanon  site identified. Area prepped in usual sterile fashon. One ml of 1% lidocaine  was used to anesthetize the area at the distal end of the implant. A small stab incision was made right beside the implant on the distal portion. The Nexplanon  rod was grasped using hemostats and removed without difficulty. There was minimal blood loss. There were no complications. Area was then injected with 3 ml of 1 % lidocaine . She was re-prepped with betadine, Nexplanon  removed from packaging, Device confirmed in needle, then inserted full length of needle and withdrawn per handbook instructions. Nexplanon  was able to palpated in the patient's arm; patient palpated the insert herself.  There was minimal blood loss. Patient insertion site covered with gauze and a pressure bandage to reduce any bruising. The patient tolerated the procedure well and was given post procedure instructions.  She was advised to have backup contraception for one week.    Eveline Lynwood MATSU, MD Attending Obstetrician & Gynecologist, [facility] and Center for [facility]  11/10/2023

## 2023-11-10 NOTE — Progress Notes (Addendum)
 43 y.o. GYN presents for Nexplanon  removal and Insertion.  Last PAP 03/08/2023  NILM   Administrations This Visit     etonogestrel  (NEXPLANON ) implant 68 mg     Admin Date 11/10/2023 Action Given Dose 68 mg Route Subdermal Documented By Burnard Niels PARAS, RMA

## 2023-11-19 ENCOUNTER — Ambulatory Visit

## 2023-11-19 VITALS — BP 132/93 | HR 102 | Ht 63.0 in | Wt 246.0 lb

## 2023-11-19 DIAGNOSIS — I10 Essential (primary) hypertension: Secondary | ICD-10-CM

## 2023-11-19 DIAGNOSIS — R519 Headache, unspecified: Secondary | ICD-10-CM

## 2023-11-19 DIAGNOSIS — Z713 Dietary counseling and surveillance: Secondary | ICD-10-CM | POA: Diagnosis not present

## 2023-11-19 MED ORDER — LOSARTAN POTASSIUM 50 MG PO TABS: 50.0000 mg | ORAL_TABLET | Freq: Every day | ORAL | 3 refills | Status: AC

## 2023-11-19 NOTE — Patient Instructions (Addendum)
 Thank you for visiting the clinic today, it was good to see you!  Our plans for today: - Start taking losartan 50 mg every day - Use only Tylenol  if you have  headaches - Avoid foods that trigger your headaches - Please start incorporating lifestyle changes as we discussed - Return next week for labs. I will message you via MyChart or call you once they are in - Log your home blood pressures  Please follow-up on 12/16/23 at 3:15pm.  Please arrive 15 minutes PRIOR to your next scheduled appointment time! If you do not, this affects OTHER patients' care.  For any questions, please call the office at [phone] or send me a message in MyChart.  It was a pleasure to take care of you today. Have a great day!  Shere Eisenhart, DO [facility] Resident, PGY-1  -------------------------------------------------------------------------------  Do you need your medications delivered to your home?   We'll send your prescription to the [facility] for delivery.          Address: [address] [place], [place], [place] [address]          Phone: [phone]  Please call the [facility] to speak with a pharmacist and set up your home medication delivery. If you have any questions, feel free to contact us  -- we're happy to help!  Other [facility] that offer affordable prices on both prescriptions and over-the-counter items, as well as convenient services like vaccinations, are  [facility], at [facility]         Address:  [address], [place], [place] [address]         Phone: [phone]  [facility], located in the [facility]        Address: [address] [place], [place] [address]        Phone: [phone]  [facility], at [facility]       Address: [address], [place], [place] [address]       Phone: [phone]  [facility], at [facility]       Address: [address], [place], [place] [address]       Phone: [phone]

## 2023-11-19 NOTE — Assessment & Plan Note (Addendum)
-   Encouraged avoiding Goody powders and to use only Tylenol  for headaches - Encouraged avoiding foods that trigger headaches - Extensively discussed lifestyle changes as below

## 2023-11-19 NOTE — Progress Notes (Signed)
    SUBJECTIVE:   CHIEF COMPLAINT / HPI:   Robin Duncan is a 43 y.o.female who presents to discuss headache and weight loss.   Headache has been ongoing for the last 6 months.  She notices it when she eats salt or pork.  Headache is around the eyes and temple area.  Denies any nausea, vomiting, photophobia, phonophobia.  No vision abnormalities.  Takes Goody powder and states that helps almost immediately. Can go a few weeks without HA but at most has 2 per week. She believes this may be related to hypertension.  States she checks her blood pressure at home and it is usually 190 systolic.  She is also interested in weight loss options and would like to discuss this further today.  PERTINENT  PMH / PSH: None  OBJECTIVE:   BP (!) 154/90   Pulse (!) 102   Ht 5' 3 (1.6 m)   Wt 246 lb (111.6 kg)   LMP 10/29/2023 (Exact Date)   SpO2 100%   BMI 43.58 kg/m    General: Alert, well-appearing female in NAD.  HEENT: No sign of trauma, EOM grossly intact. Neck: Supple, normal ROM Cardiovascular: RRR, no m/r/g appreciated. Radial pulse +2 bilaterally Pulmonary: Normal WOB. CTAB with no w/c/r present. Abdomen: Soft, non-tender, non-distended. Extremities: Warm and well-perfused, without edema. Neurologic: Normal gait, moves all four extremities appropriately Skin: No rashes or lesions. Psych: Appropriate mood and affect  ASSESSMENT/PLAN:   Assessment & Plan Hypertension, unspecified type BP 154/90, with repeat 132/93.  Believe her home readings of 190 systolic may be inaccurate given readings in the office are appropriate. - Start Losartan 50mg  daily - Patient plans to purchase a blood pressure cuff and keep a log to ensure home readings are accurate - Return visit scheduled for 12/16/23  at 3:15 pm - Placed orders for BMP, Hgb A1c and lipid panel. Patient to return next week for lab visit. Headache - Encouraged avoiding Goody powders and to use only Tylenol  for headaches -  Encouraged avoiding foods that trigger headaches - Extensively discussed lifestyle changes as below Weight loss counseling, encounter for Patient's BMI: 43.58 places patient in category of Class 3 obesity or severe obesity. Patient is currently in the behavioral change stage of contemplation. Patient's goal is to initiate weight loss efforts.  Discussed making smarter food choices, increasing vegetables in the diet, decreasing carbohydrates, and better snacking choices.  Also discussed exercise.  Patient states she has been thinking about getting a gym membership.  Also encouraged activities such as walking which can be easily incorporated into a evening family routine. Goal is for a minimum of 150 minutes of moderate intensity aerobic activity per week Activity should occur on at least 3 days out of 7. - Follow up visit scheduled with me for 12/16/23 at 3:15   Darren Jernigan, DO [facility]

## 2023-11-20 NOTE — Assessment & Plan Note (Signed)
 Patient's BMI: 43.58 places patient in category of Class 3 obesity or severe obesity. Patient is currently in the behavioral change stage of contemplation. Patient's goal is to initiate weight loss efforts.  Discussed making smarter food choices, increasing vegetables in the diet, decreasing carbohydrates, and better snacking choices.  Also discussed exercise.  Patient states she has been thinking about getting a gym membership.  Also encouraged activities such as walking which can be easily incorporated into a evening family routine. Goal is for a minimum of 150 minutes of moderate intensity aerobic activity per week Activity should occur on at least 3 days out of 7. - Follow up visit scheduled with me for 12/16/23 at 3:15

## 2023-11-22 ENCOUNTER — Other Ambulatory Visit

## 2023-11-22 DIAGNOSIS — I10 Essential (primary) hypertension: Secondary | ICD-10-CM

## 2023-11-23 LAB — BASIC METABOLIC PANEL WITH GFR
BUN/Creatinine Ratio: 15 (ref 9–23)
BUN: 15 mg/dL (ref 6–24)
CO2: 22 mmol/L (ref 20–29)
Calcium: 9.3 mg/dL (ref 8.7–10.2)
Chloride: 101 mmol/L (ref 96–106)
Creatinine, Ser: 1.01 mg/dL — ABNORMAL HIGH (ref 0.57–1.00)
Glucose: 83 mg/dL (ref 70–99)
Potassium: 4.1 mmol/L (ref 3.5–5.2)
Sodium: 138 mmol/L (ref 134–144)
eGFR: 70 mL/min/1.73 (ref >59)

## 2023-11-23 LAB — LIPID PANEL
Chol/HDL Ratio: 3 ratio (ref 0.0–4.4)
Cholesterol, Total: 155 mg/dL (ref 100–199)
HDL: 51 mg/dL (ref >39)
LDL Chol Calc (NIH): 89 mg/dL (ref 0–99)
Triglycerides: 77 mg/dL (ref 0–149)
VLDL Cholesterol Cal: 15 mg/dL (ref 5–40)

## 2023-11-23 LAB — HEMOGLOBIN A1C
Est. average glucose Bld gHb Est-mCnc: 108 mg/dL
Hgb A1c MFr Bld: 5.4 % (ref 4.8–5.6)

## 2023-11-26 ENCOUNTER — Ambulatory Visit: Payer: Self-pay

## 2023-11-26 NOTE — Telephone Encounter (Signed)
 Cr noted to be elevated to 1.01. Baseline around 0.7 in 2023. Plan to repeat at next visit on 12/16/23.

## 2023-12-08 ENCOUNTER — Encounter: Admitting: Obstetrics and Gynecology

## 2023-12-16 ENCOUNTER — Ambulatory Visit: Payer: Self-pay

## 2024-01-06 ENCOUNTER — Ambulatory Visit

## 2024-01-06 VITALS — BP 116/75 | HR 95 | Ht 63.0 in | Wt 249.8 lb

## 2024-01-06 DIAGNOSIS — Z713 Dietary counseling and surveillance: Secondary | ICD-10-CM

## 2024-01-06 NOTE — Patient Instructions (Signed)
 Thank you for visiting the clinic today, it was good to see you!  Our plans for today: - Continue lifestyle changes including diet and exercise - Focus on incorporating a healthy breakfast - Reduces the amounts of sweets - Limit alcohol use to weekends - Referral placed for [facility] - Labs: BMP. I will send you a MyChart message or a letter if results are normal. Otherwise, I will give you a call.  Please follow-up in 1 month. Make sure to bring ALL of your medications with you to every visit.   Please arrive 15 minutes PRIOR to your next scheduled appointment time! If you do not, this affects OTHER patients' care.  For any questions, please call the office at [phone] or send me a message in MyChart.  It was a pleasure to take care of you today. Have a great day!  Darren Jernigan, DO [facility] Resident, PGY-1     Food and Nutrition Websites Dr. Maryann:  Created by pediatrician Veatrice Harpin MD, MPH to help her patients improve their food choices, the site offers recipes and a wealth of information via videos and articles.   https://www.doctoryum.org/  My Plate website:  Information on how to design well-balanced meals, including recipes and eating well on a budget.   https://www.carpenter-henry.info/  [facility] Extension: Lots of reliable information about food, nutrition, gardening, and physical activity.   https://[place].sodawaters.hu  Lipstickblog.hu: The Academy of Nutrition and Dietetics provides lots of information about nutrition and other aspects of health, including for different segments of the population, i.e., kids, seniors, men, women, and LGBTQ.   comfortablecloset.com.ee  Diabetes.org: The American Diabetes Association offers helpful nutrition, meal planning, and shopping tips for everyone whether you have diabetes or not.   https://diabetes.org/  Heart.org: In addition to heart-related  health topics, this site offers excellent suggestions for healthy foods and behaviors for everyone, not just for those with or at risk of heart disease.   theytell.is

## 2024-01-06 NOTE — Progress Notes (Unsigned)
° ° °  SUBJECTIVE:   CHIEF COMPLAINT / HPI:   Robin Duncan is a 43 y.o.female who presents today for BP f/u. Current medications include Losartan  50mg  which they {Actions; are/are not:16769} taking as prescribed. They {Blank single:19197::do,do not} check BP at home which is usually *** systolic and *** diastolic. Denies any SOB, CP, vision changes, LE edema, medication SEs, or symptoms of hypotension  ACV, multivitamin, vitamin c, turmeric pill  Has reduced carbs, sugars. Drinking more water. Etoh 3 days/work, 6 pack, 16 oz - limit to weekends  PERTINENT  PMH / PSH: ***  OBJECTIVE:   BP 116/75   Pulse 95   Ht 5' 3 (1.6 m)   Wt 249 lb 12.8 oz (113.3 kg)   SpO2 100%   BMI 44.25 kg/m   ***  ASSESSMENT/PLAN:   Assessment & Plan    PLAN Initial 116/75 which is at goal - Changes to current regimen include *** - Referral to pharmacy clinic due to {KOVALBPCRITERIA:29903} - Labs: BMP, urine microalbumin/creatinine *** - BP log added to patient's AVS*** - Encouraged to increase physical activity as tolerated.  - Heart healthy dietary choices discussed.  - Follow up in *** weeks - {vbfuappt:33920}  Darren Jernigan, DO [facility]

## 2024-01-07 LAB — BASIC METABOLIC PANEL WITH GFR
BUN/Creatinine Ratio: 10 (ref 9–23)
BUN: 7 mg/dL (ref 6–24)
CO2: 22 mmol/L (ref 20–29)
Calcium: 9.2 mg/dL (ref 8.7–10.2)
Chloride: 105 mmol/L (ref 96–106)
Creatinine, Ser: 0.72 mg/dL (ref 0.57–1.00)
Glucose: 91 mg/dL (ref 70–99)
Potassium: 4.1 mmol/L (ref 3.5–5.2)
Sodium: 142 mmol/L (ref 134–144)
eGFR: 105 mL/min/1.73 (ref >59)

## 2024-01-09 ENCOUNTER — Ambulatory Visit: Payer: Self-pay

## 2024-01-09 DIAGNOSIS — I10 Essential (primary) hypertension: Secondary | ICD-10-CM | POA: Insufficient documentation

## 2024-01-09 NOTE — Assessment & Plan Note (Signed)
 Initial BP 116/75 which is at goal - Continue current regimen - Labs: BMP to evaluate previously elevated Cr to 1.01 - Diet and exercise discussed as below

## 2024-01-09 NOTE — Assessment & Plan Note (Signed)
 Patient's BMI 44.25 places patient in category of Class 3 obesity. Patient is currently in the behavioral change stage of action. Patient's goal is to continue weight loss efforts. Discussed several behavior change strategies (diet and exercise) which were included in AVS. Also discussed alcohol use which patient will limit to 1 day /week. Extensively discussed medications that may benefit patient at this time. With shared decision making, patient would like to continue with lifestyle modifications for now . - Continue dietary modifications - Encouraged increasing physical activity to 3 days per week, 30 minutes - Referral placed for [facility] - Follow up has been scheduled with me for 02/10/24 at 3:30pm - Reassess alcohol use at next visit

## 2024-02-10 ENCOUNTER — Ambulatory Visit: Payer: Self-pay

## 2024-02-10 VITALS — BP 116/89 | HR 109 | Ht 63.0 in | Wt 243.6 lb

## 2024-02-10 DIAGNOSIS — Z0000 Encounter for general adult medical examination without abnormal findings: Secondary | ICD-10-CM

## 2024-02-10 DIAGNOSIS — Z713 Dietary counseling and surveillance: Secondary | ICD-10-CM | POA: Diagnosis not present

## 2024-02-10 NOTE — Progress Notes (Signed)
" ° ° °  SUBJECTIVE:   Chief compliant/HPI: annual examination  Robin Duncan is a 44 y.o. who presents today for an annual exam.   Patient continues to work on dietary and lifestyle changes. States she has is exercising 3-4x week for 30 min at a time, participating in walking and weight lifting. She has eliminated carbohydrates, reduced sugar intake and added more vegetables and fiber to her diet.  She is drinking only water and has reduced alcohol intake to 1-2 light beers per week.  No other concerns.  OBJECTIVE:   BP 116/89   Pulse (!) 109   Ht 5' 3 (1.6 m)   Wt 243 lb 9.6 oz (110.5 kg)   SpO2 100%   BMI 43.15 kg/m    General: Alert, well-appearing female in NAD.  Cardiovascular: RRR, no m/r/g appreciated.  Pulmonary: Normal WOB. CTAB with no w/c/r present. Abdomen: Soft, non-tender, non-distended. Extremities: Warm and well-perfused, without cyanosis or edema. Psych: Appropriate mood and affect  ASSESSMENT/PLAN:   Assessment & Plan Weight loss counseling, encounter for 6 lb weight loss since last visit on 01/06/24. Congratulated patient on her efforts and encouraged her to continue with efforts. Patient states she is doing well and is very motivated to continue with current routine. Weight loss goal of 1lb/week. Cage questionnaire negative. - Continue dietary and lifestyle modifications Follow up has been scheduled with me for 04/27/24 at 3:30PM Annual Examination  See AVS for age appropriate recommendations.   PHQ score 5, reviewed.  Blood pressure reviewed and at goal.  Asked about intimate partner violence and resources given as appropriate  The patient currently uses nothing for contraception.  Considered the following items based upon USPSTF recommendations: Diabetes screening: recently completed and result reviewed, normal  HIV testing: previously completed and result reviewed, normal  Hepatitis C: previously completed and result reviewed, normal  Hepatitis  B:previously completed and result reviewed, normal  Syphilis if at high risk: discussed and declined GC/CT not at high risk and not ordered. Lipid panel (nonfasting or fasting) discussed based upon AHA recommendations and recently completed and repeat not yet indicated.  Consider repeat every 4-6 years.  Reviewed risk factors for latent tuberculosis and not indicated  Discussed family history, BRCA testing not indicated.  Cervical cancer screening: prior Pap reviewed, repeat due in 03/09/2028 Breast cancer screening: recently completed and repeat not yet indicated Colorectal cancer screening: discussed, colonoscopy ordered if age 11 or over.   Follow up in 1 year or sooner if indicated.  MyChart Activation: Already signed up  Darren Jernigan, DO [facility]  "

## 2024-02-10 NOTE — Patient Instructions (Signed)
 Thank you for visiting the clinic today, it was good to see you!  Our plans for today: - You are doing amazing with your diet and exercise changes. Keep up the good work! - Referral placed for Colonoscopy. The office will call you to set up an appointment. Please give us  a call if you don't hear back in the next 2 weeks.  Please follow-up on 04/27/24 at 3:30 PM. Make sure to bring ALL of your medications with you to every visit.   Please arrive 15 minutes PRIOR to your next scheduled appointment time! If you do not, this affects OTHER patients' care.  For any questions, please call the office at [phone] or send me a message in MyChart.  It was a pleasure to take care of you today. Have a great day!  Asaiah Scarber, DO [facility] Resident, PGY-1  -----------------------------------------------------------------------------  Do you need your medications delivered to your home?   Well send your prescription to the [facility] for delivery.          Address: [address] [place], [place], [place] [address]          Phone: [phone]  Please call the [facility] to speak with a pharmacist and set up your home medication delivery. If you have any questions, feel free to contact us  -- were happy to help!  Other [facility] that offer affordable prices on both prescriptions and over-the-counter items, as well as convenient services like vaccinations, are  [facility], at [facility]         Address: [address], [place], [place] [address]         Phone: [phone]  [facility], located in [facility]        Address: [address], [place], [place] [address]        Phone: [phone]  [facility], at [facility]       Address: [address], [place], [place] [address]       Phone: [phone]  [facility], at [facility]       Address: [address], [place], [place] [address]       Phone: [phone]  -----------------------------------------------------------------------------

## 2024-02-13 NOTE — Assessment & Plan Note (Signed)
 6 lb weight loss since last visit on 01/06/24. Congratulated patient on her efforts and encouraged her to continue with efforts. Patient states she is doing well and is very motivated to continue with current routine. Weight loss goal of 1lb/week. Cage questionnaire negative. - Continue dietary and lifestyle modifications Follow up has been scheduled with me for 04/27/24 at 3:30PM

## 2024-04-27 ENCOUNTER — Ambulatory Visit: Payer: Self-pay
